# Patient Record
Sex: Male | Born: 1970 | Race: Black or African American | Hispanic: No | Smoking: Current every day smoker
Health system: Southern US, Community
[De-identification: ages and names within clinical notes are randomized; demographics above are authoritative.]

## PROBLEM LIST (undated history)

## (undated) DIAGNOSIS — J9600 Acute respiratory failure, unspecified whether with hypoxia or hypercapnia: Secondary | ICD-10-CM

## (undated) DIAGNOSIS — T50901A Poisoning by unspecified drugs, medicaments and biological substances, accidental (unintentional), initial encounter: Secondary | ICD-10-CM

## (undated) DIAGNOSIS — I639 Cerebral infarction, unspecified: Secondary | ICD-10-CM

---

## 1998-12-21 ENCOUNTER — Emergency Department (HOSPITAL_COMMUNITY): Admission: EM | Admit: 1998-12-21 | Discharge: 1998-12-21 | Payer: Self-pay | Admitting: Emergency Medicine

## 2001-01-30 ENCOUNTER — Emergency Department (HOSPITAL_COMMUNITY): Admission: EM | Admit: 2001-01-30 | Discharge: 2001-01-31 | Payer: Self-pay | Admitting: *Deleted

## 2004-12-20 ENCOUNTER — Emergency Department (HOSPITAL_COMMUNITY): Admission: EM | Admit: 2004-12-20 | Discharge: 2004-12-20 | Payer: Self-pay | Admitting: Emergency Medicine

## 2009-03-25 ENCOUNTER — Emergency Department (HOSPITAL_COMMUNITY): Admission: EM | Admit: 2009-03-25 | Discharge: 2009-03-25 | Payer: Self-pay | Admitting: *Deleted

## 2011-01-09 ENCOUNTER — Emergency Department (HOSPITAL_COMMUNITY)
Admission: EM | Admit: 2011-01-09 | Discharge: 2011-01-09 | Disposition: A | Discharge status: Home / Self Care | Payer: Self-pay | Attending: Emergency Medicine | Admitting: Emergency Medicine

## 2011-01-09 ENCOUNTER — Emergency Department (HOSPITAL_COMMUNITY): Payer: Self-pay

## 2011-01-09 DIAGNOSIS — M7989 Other specified soft tissue disorders: Secondary | ICD-10-CM | POA: Insufficient documentation

## 2011-01-09 DIAGNOSIS — M79609 Pain in unspecified limb: Secondary | ICD-10-CM | POA: Insufficient documentation

## 2011-01-09 DIAGNOSIS — IMO0002 Reserved for concepts with insufficient information to code with codable children: Secondary | ICD-10-CM | POA: Insufficient documentation

## 2011-01-09 DIAGNOSIS — Y92009 Unspecified place in unspecified non-institutional (private) residence as the place of occurrence of the external cause: Secondary | ICD-10-CM | POA: Insufficient documentation

## 2011-01-09 DIAGNOSIS — S62339A Displaced fracture of neck of unspecified metacarpal bone, initial encounter for closed fracture: Secondary | ICD-10-CM | POA: Insufficient documentation

## 2011-02-12 ENCOUNTER — Ambulatory Visit (HOSPITAL_COMMUNITY)
Admission: RE | Admit: 2011-02-12 | Discharge: 2011-02-12 | Disposition: A | Discharge status: Home / Self Care | Payer: Self-pay | Source: Ambulatory Visit | Attending: Plastic Surgery | Admitting: Plastic Surgery

## 2011-02-12 ENCOUNTER — Other Ambulatory Visit (HOSPITAL_COMMUNITY): Payer: Self-pay | Admitting: Plastic Surgery

## 2011-02-12 DIAGNOSIS — IMO0001 Reserved for inherently not codable concepts without codable children: Secondary | ICD-10-CM | POA: Insufficient documentation

## 2011-02-12 DIAGNOSIS — T148XXA Other injury of unspecified body region, initial encounter: Secondary | ICD-10-CM

## 2011-04-13 ENCOUNTER — Emergency Department (HOSPITAL_COMMUNITY)
Admission: EM | Admit: 2011-04-13 | Discharge: 2011-04-13 | Disposition: A | Discharge status: Home / Self Care | Payer: Self-pay | Attending: Emergency Medicine | Admitting: Emergency Medicine

## 2011-04-13 DIAGNOSIS — S41109A Unspecified open wound of unspecified upper arm, initial encounter: Secondary | ICD-10-CM | POA: Insufficient documentation

## 2011-04-13 DIAGNOSIS — Y9229 Other specified public building as the place of occurrence of the external cause: Secondary | ICD-10-CM | POA: Insufficient documentation

## 2011-04-13 DIAGNOSIS — X58XXXA Exposure to other specified factors, initial encounter: Secondary | ICD-10-CM | POA: Insufficient documentation

## 2011-04-23 ENCOUNTER — Emergency Department (HOSPITAL_COMMUNITY)
Admission: EM | Admit: 2011-04-23 | Discharge: 2011-04-23 | Disposition: A | Discharge status: Home / Self Care | Payer: Self-pay | Attending: Emergency Medicine | Admitting: Emergency Medicine

## 2011-04-23 DIAGNOSIS — Z4802 Encounter for removal of sutures: Secondary | ICD-10-CM | POA: Insufficient documentation

## 2011-08-07 ENCOUNTER — Emergency Department (HOSPITAL_COMMUNITY)
Admission: EM | Admit: 2011-08-07 | Discharge: 2011-08-07 | Disposition: A | Discharge status: Home / Self Care | Payer: Self-pay | Attending: Emergency Medicine | Admitting: Emergency Medicine

## 2011-08-07 ENCOUNTER — Emergency Department (HOSPITAL_COMMUNITY): Payer: Self-pay

## 2011-08-07 DIAGNOSIS — M25569 Pain in unspecified knee: Secondary | ICD-10-CM | POA: Insufficient documentation

## 2011-08-07 DIAGNOSIS — IMO0002 Reserved for concepts with insufficient information to code with codable children: Secondary | ICD-10-CM | POA: Insufficient documentation

## 2011-08-07 DIAGNOSIS — W19XXXA Unspecified fall, initial encounter: Secondary | ICD-10-CM | POA: Insufficient documentation

## 2014-09-24 ENCOUNTER — Emergency Department (HOSPITAL_COMMUNITY): Payer: Self-pay

## 2014-09-24 ENCOUNTER — Emergency Department (HOSPITAL_COMMUNITY)
Admission: EM | Admit: 2014-09-24 | Discharge: 2014-09-24 | Disposition: A | Discharge status: Home / Self Care | Payer: Self-pay | Attending: Emergency Medicine | Admitting: Emergency Medicine

## 2014-09-24 ENCOUNTER — Encounter (HOSPITAL_COMMUNITY): Payer: Self-pay | Admitting: Cardiology

## 2014-09-24 DIAGNOSIS — Z720 Tobacco use: Secondary | ICD-10-CM | POA: Insufficient documentation

## 2014-09-24 DIAGNOSIS — Y9389 Activity, other specified: Secondary | ICD-10-CM | POA: Insufficient documentation

## 2014-09-24 DIAGNOSIS — Y998 Other external cause status: Secondary | ICD-10-CM | POA: Insufficient documentation

## 2014-09-24 DIAGNOSIS — S62314A Displaced fracture of base of fourth metacarpal bone, right hand, initial encounter for closed fracture: Secondary | ICD-10-CM | POA: Insufficient documentation

## 2014-09-24 DIAGNOSIS — S62309A Unspecified fracture of unspecified metacarpal bone, initial encounter for closed fracture: Secondary | ICD-10-CM

## 2014-09-24 DIAGNOSIS — S60511A Abrasion of right hand, initial encounter: Secondary | ICD-10-CM | POA: Insufficient documentation

## 2014-09-24 DIAGNOSIS — Y9289 Other specified places as the place of occurrence of the external cause: Secondary | ICD-10-CM | POA: Insufficient documentation

## 2014-09-24 DIAGNOSIS — T1490XA Injury, unspecified, initial encounter: Secondary | ICD-10-CM

## 2014-09-24 MED ORDER — HYDROCODONE-ACETAMINOPHEN 5-325 MG PO TABS: 1.0000 | ORAL_TABLET | Freq: Four times a day (QID) | ORAL | Status: DC | PRN

## 2014-09-24 MED ORDER — HYDROCODONE-ACETAMINOPHEN 5-325 MG PO TABS
2.0000 | ORAL_TABLET | Freq: Once | ORAL | Status: AC
  Administered 2014-09-24: 2 via ORAL
  Filled 2014-09-24: qty 2

## 2014-09-24 MED ORDER — ONDANSETRON 4 MG PO TBDP
8.0000 mg | ORAL_TABLET | Freq: Once | ORAL | Status: AC
Start: 2014-09-24 — End: 2014-09-24
  Administered 2014-09-24: 8 mg via ORAL
  Filled 2014-09-24: qty 2
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
## 2014-09-24 NOTE — ED Notes (Signed)
Ortho at bedside applied splint.

## 2014-09-24 NOTE — Progress Notes (Signed)
Orthopedic Tech Progress Note Patient Details:  Logan Dixon November 25, 1970 [id number]  Ortho Devices Type of Ortho Device: Ace wrap, Ulna gutter splint Ortho Device/Splint Location: rue Ortho Device/Splint Interventions: Application   Logan Dixon 09/24/2014, 11:58 AM

## 2014-09-24 NOTE — ED Notes (Signed)
Pt transported to xray 
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
## 2014-09-24 NOTE — Discharge Instructions (Signed)
It is very important that you follow up with the hand surgeon. Please call his office on Monday morning. You can take the Norco for pain. Please do not combine this with alcohol. Do not drive or operate machinery. Return to the emergency department for new or worsening symptoms.   Metacarpal Fracture   The metacarpal bones are in the middle of the hand, connecting the fingers to the wrist. A metacarpal fracture is a break in one of these bones. It is common for an injury of the hand to break one or more of these bones. A metacarpal fracture of the fifth (little) finger, near the knuckle, is also known as a boxer's fracture. SYMPTOMS   Severe pain at the time of injury.  Pain, tenderness, swelling (especially the back of the hand).  Bruising of the hand within 48 hours.  Visible deformity, if the fracture out of alignment (displaced).  Numbness or paralysis from swelling in the hand, causing pressure on the blood vessels or nerves (uncommon). CAUSES   Direct hit (trauma) to the hand, such as a striking blow with the fist.  Indirect stress to the hand, such as twisting or violent muscle contraction (uncommon). RISK INCREASES WITH:  Contact sports (football, rugby, soccer).  Sports that require hitting (boxing, martial arts).  History of bone or joint disease, including osteoporosis.  Poor hand strength and flexibility. PREVENTION  Maintain proper conditioning:  Hand and finger strength.  Flexibility and endurance.  For contact sports, wear properly fitted and padded protective equipment for the hand.  Learn and use proper technique when hitting, punching, and landing from a fall. PROGNOSIS If treated properly, metacarpal fractures can be expected to heal within 4 to 6 weeks. For severe injuries, surgery may be needed. RELATED COMPLICATIONS   Fracture does not heal (nonunion).  Heals in a poor position, including twisted fingers (malunion).  Chronic pain, stiffness, or  swelling of the hand.  Excessive bleeding in the hand, causing pressure and injury to nerves and blood vessels (rare).  Unstable or arthritic joint, following repeated injury or delayed treatment.  Hindrance of normal hand growth in children.  Infection in open fractures (skin broken over fracture) or at the incision or pin sites, if surgery was performed.  Shortening or injured bones.  Bony bump (spur) or loss of shape of the knuckles. TREATMENT  Treatment will vary, depending on the extent of the injury. First, ice and medicine will help reduce pain and inflammation. For a single metacarpal fracture that is not displaced and does not involve the joint, restraint is usually sufficient for healing to occur. Multiple metacarpal fractures, fractures that are displaced, or fractures involving the joint may require surgery. Surgery often involves placing pins and screws in the bones, to hold them in place. Restraint of the injury follows surgery, to allow for healing. After restraint (with or without surgery), stretching and strengthening exercises may be needed to regain strength and a full range of motion. Exercises may be done at home or with a therapist. Sometimes, depending on the sport and position, a brace or splint may be needed when first returning to sports. MEDICATION   Do not take pain medicine for 7 days before surgery.  Only take over-the-counter or prescription medicines for pain, fever, or discomfort as directed by your caregiver.  Prescription pain medicines are usually prescribed only after surgery. Use only as directed and only as much as you need. COLD THERAPY  Cold treatment (icing) should be applied for 10 to  15 minutes every 2 to 3 hours for inflammation and pain, and immediately after activity that aggravates your symptoms. Use ice packs or an ice massage. SEEK IMMEDIATE MEDICAL CARE IF:   Pain, tenderness, or swelling gets worse even with treatment.  You have pain,  numbness, or coldness in the hand.  Blue, gray, or dark color appears in the fingernails.  Any of the following occur after surgery:  You have an oral temperature above 102 F (38.9 C), not controlled by medicine.  You have increased pain, swelling, redness, drainage of fluids, or bleeding in the affected area.  New, unexplained symptoms develop. (Drugs used in treatment may produce side effects.) Document Released: 11/11/1998 Document Revised: 01/20/2012 Document Reviewed: 02/09/2009 [facility] Patient Information 2015 [employer] [place]. This information is not intended to replace advice given to you by your health care provider. Make sure you discuss any questions you have with your health care provider.  Splint Care Splints protect and rest injuries. Splints can be made of plaster, fiberglass, or metal. They are used to treat broken bones, sprains, tendonitis, and other injuries. HOME CARE  Keep the injured area raised (elevated) while sitting or lying down. Keep the injured body part just above the level of the heart. This will decrease puffiness (swelling) and pain.  If an elastic bandage was used to hold the splint, it can be loosened. Only loosen it to make room for puffiness and to ease pain.  Keep the splint clean and dry.  Do not scratch the skin under the splint with sharp or pointed objects.  Follow up with your doctor as told. GET HELP RIGHT AWAY IF:   There is more pain or pressure around the injury.  There is numbness, tingling, or pain in the toes or fingers past the injury.  The fingers or toes become cold or blue.  The splint becomes too soft or breaks before the injury is healed. MAKE SURE YOU:   Understand these instructions.  Will watch this condition.  Will get help right away if you are not doing well or get worse. Document Released: 08/06/2008 Document Revised: 01/20/2012 Document Reviewed: 08/06/2008 [facility] Patient Information 2015 [place] [employer] [employer] [place].  This information is not intended to replace advice given to you by your health care provider. Make sure you discuss any questions you have with your health care provider.
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
## 2015-11-20 ENCOUNTER — Emergency Department (HOSPITAL_COMMUNITY): Payer: Self-pay

## 2015-11-20 ENCOUNTER — Emergency Department (HOSPITAL_COMMUNITY)
Admission: EM | Admit: 2015-11-20 | Discharge: 2015-11-20 | Disposition: A | Discharge status: Home / Self Care | Payer: Self-pay | Attending: Emergency Medicine | Admitting: Emergency Medicine

## 2015-11-20 ENCOUNTER — Encounter (HOSPITAL_COMMUNITY): Payer: Self-pay | Admitting: Family Medicine

## 2015-11-20 DIAGNOSIS — B349 Viral infection, unspecified: Secondary | ICD-10-CM | POA: Insufficient documentation

## 2015-11-20 DIAGNOSIS — R197 Diarrhea, unspecified: Secondary | ICD-10-CM

## 2015-11-20 DIAGNOSIS — M545 Low back pain, unspecified: Secondary | ICD-10-CM

## 2015-11-20 DIAGNOSIS — R1084 Generalized abdominal pain: Secondary | ICD-10-CM | POA: Insufficient documentation

## 2015-11-20 DIAGNOSIS — F17210 Nicotine dependence, cigarettes, uncomplicated: Secondary | ICD-10-CM | POA: Insufficient documentation

## 2015-11-20 DIAGNOSIS — R509 Fever, unspecified: Secondary | ICD-10-CM

## 2015-11-20 DIAGNOSIS — R000 Tachycardia, unspecified: Secondary | ICD-10-CM | POA: Insufficient documentation

## 2015-11-20 DIAGNOSIS — R52 Pain, unspecified: Secondary | ICD-10-CM

## 2015-11-20 LAB — COMPREHENSIVE METABOLIC PANEL
ALBUMIN: 3.9 g/dL (ref 3.5–5.0)
ALK PHOS: 74 U/L (ref 38–126)
ALT: 31 U/L (ref 17–63)
AST: 27 U/L (ref 15–41)
Anion gap: 9 (ref 5–15)
BILIRUBIN TOTAL: 2.1 mg/dL — AB (ref 0.3–1.2)
BUN: 9 mg/dL (ref 6–20)
CALCIUM: 9.4 mg/dL (ref 8.9–10.3)
CO2: 24 mmol/L (ref 22–32)
Chloride: 102 mmol/L (ref 101–111)
Creatinine, Ser: 1.16 mg/dL (ref 0.61–1.24)
GFR calc Af Amer: >60 mL/min (ref >60)
GFR calc non Af Amer: >60 mL/min (ref >60)
GLUCOSE: 90 mg/dL (ref 65–99)
Potassium: 4 mmol/L (ref 3.5–5.1)
SODIUM: 135 mmol/L (ref 135–145)
TOTAL PROTEIN: 7.3 g/dL (ref 6.5–8.1)

## 2015-11-20 LAB — URINE MICROSCOPIC-ADD ON: RBC / HPF: NONE SEEN RBC/hpf (ref 0–5)

## 2015-11-20 LAB — CBC
HEMATOCRIT: 46.2 % (ref 39.0–52.0)
HEMOGLOBIN: 15.9 g/dL (ref 13.0–17.0)
MCH: 29.9 pg (ref 26.0–34.0)
MCHC: 34.4 g/dL (ref 30.0–36.0)
MCV: 87 fL (ref 78.0–100.0)
Platelets: 271 10*3/uL (ref 150–400)
RBC: 5.31 MIL/uL (ref 4.22–5.81)
RDW: 13.6 % (ref 11.5–15.5)
WBC: 8.4 10*3/uL (ref 4.0–10.5)

## 2015-11-20 LAB — URINALYSIS, ROUTINE W REFLEX MICROSCOPIC
Glucose, UA: NEGATIVE mg/dL
HGB URINE DIPSTICK: NEGATIVE
KETONES UR: 40 mg/dL — AB
NITRITE: NEGATIVE
PH: 6 (ref 5.0–8.0)
Protein, ur: NEGATIVE mg/dL
SPECIFIC GRAVITY, URINE: 1.021 (ref 1.005–1.030)

## 2015-11-20 LAB — LIPASE, BLOOD: Lipase: 20 U/L (ref 11–51)

## 2015-11-20 MED ORDER — ACETAMINOPHEN 325 MG PO TABS
325.0000 mg | ORAL_TABLET | Freq: Once | ORAL | Status: DC
  Filled 2015-11-20: qty 1

## 2015-11-20 MED ORDER — SODIUM CHLORIDE 0.9 % IV BOLUS (SEPSIS)
1000.0000 mL | Freq: Once | INTRAVENOUS | Status: AC
  Administered 2015-11-20: 1000 mL via INTRAVENOUS

## 2015-11-20 MED ORDER — ACETAMINOPHEN 325 MG PO TABS
650.0000 mg | ORAL_TABLET | Freq: Once | ORAL | Status: AC
Start: 2015-11-20 — End: 2015-11-20
  Administered 2015-11-20: 650 mg via ORAL
  Filled 2015-11-20: qty 2

## 2015-11-20 MED ORDER — MORPHINE SULFATE (PF) 4 MG/ML IV SOLN
4.0000 mg | Freq: Once | INTRAVENOUS | Status: AC
  Administered 2015-11-20: 4 mg via INTRAVENOUS
  Filled 2015-11-20: qty 1
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
## 2015-11-20 NOTE — ED Notes (Signed)
Pt here for abd pain, body pains, diarrhea that started last night. sts mid abd pain.

## 2015-11-20 NOTE — ED Notes (Signed)
Patient transported to Ultrasound 

## 2015-11-20 NOTE — Discharge Instructions (Signed)
Stay well hydrated with small sips of fluids throughout the day. Use tylenol or motrin as needed for pain or fever. Follow a BRAT (banana-rice-applesauce-toast) diet as described below for the next 24-48 hours. The 'BRAT' diet is suggested, then progress to diet as tolerated as symptoms abate. Call [facility] if bloody stools, persistent diarrhea, vomiting, fever or worsening abdominal pain. Follow up with [facility] in 2-3 days (walk in hours after 9am on weekdays) to recheck symptoms and for ongoing medical care. Return to ER for changing or worsening of symptoms, including but not limited to: worsening abdominal pain, nausea/vomiting, or fevers persisting despite tylenol/motrin.  Abdominal (belly) pain can be caused by many things. Your caregiver performed an examination and possibly ordered blood/urine tests and imaging (CT scan, x-rays, ultrasound). Many cases can be observed and treated at home after initial evaluation in the emergency department. Even though you are being discharged home, abdominal pain can be unpredictable. Therefore, you need a repeated exam if your pain does not resolve, returns, or worsens. Most patients with abdominal pain don't have to be admitted to the hospital or have surgery, but serious problems like appendicitis and gallbladder attacks can start out as nonspecific pain. Many abdominal conditions cannot be diagnosed in one visit, so follow-up evaluations are very important. SEEK IMMEDIATE MEDICAL ATTENTION IF YOU DEVELOP ANY OF THE FOLLOWING SYMPTOMS:  The pain does not go away or becomes severe.   A temperature above 101 develops.   Repeated vomiting occurs (multiple episodes).   The pain becomes localized to portions of the abdomen. The right side could possibly be appendicitis. In an adult, the left lower portion of the abdomen could be colitis or diverticulitis.   Blood is being passed in stools or vomit (bright red or black tarry  stools).   Return also if you develop chest pain, difficulty breathing, dizziness or fainting, or become confused, poorly responsive, or inconsolable (young children).  The constipation stays for more than 4 days.   There is belly (abdominal) or rectal pain.   You do not seem to be getting better.   Food Choices to Help Relieve Diarrhea When you have diarrhea, the foods you eat and your eating habits are very important. Choosing the right foods and drinks can help relieve diarrhea. Also, because diarrhea can last up to 7 days, you need to replace lost fluids and electrolytes (such as sodium, potassium, and chloride) in order to help prevent dehydration.  WHAT GENERAL GUIDELINES DO I NEED TO FOLLOW?  Slowly drink 1 cup (8 oz) of fluid for each episode of diarrhea. If you are getting enough fluid, your urine will be clear or pale yellow.  Eat starchy foods. Some good choices include white rice, white toast, pasta, low-fiber cereal, baked potatoes (without the skin), saltine crackers, and bagels.  Avoid large servings of any cooked vegetables.  Limit fruit to two servings per day. A serving is  cup or 1 small piece.  Choose foods with less than 2 g of fiber per serving.  Limit fats to less than 8 tsp (38 g) per day.  Avoid fried foods.  Eat foods that have probiotics in them. Probiotics can be found in certain dairy products.  Avoid foods and beverages that may increase the speed at which food moves through the stomach and intestines (gastrointestinal tract). Things to avoid include:  High-fiber foods, such as dried fruit, raw fruits and vegetables, nuts, seeds, and whole grain foods.  Spicy  foods and high-fat foods.  Foods and beverages sweetened with high-fructose corn syrup, honey, or sugar alcohols such as xylitol, sorbitol, and mannitol. WHAT FOODS ARE RECOMMENDED? Grains White rice. White, [place], or pita breads (fresh or toasted), including plain rolls, buns, or bagels.  White pasta. Saltine, soda, or graham crackers. Pretzels. Low-fiber cereal. Cooked cereals made with water (such as cornmeal, farina, or cream cereals). Plain muffins. Matzo. Melba toast. Zwieback.  Vegetables Potatoes (without the skin). Strained tomato and vegetable juices. Most well-cooked and canned vegetables without seeds. Tender lettuce. Fruits Cooked or canned applesauce, apricots, cherries, fruit cocktail, grapefruit, peaches, pears, or plums. Fresh bananas, apples without skin, cherries, grapes, cantaloupe, grapefruit, peaches, oranges, or plums.  Meat and Other Protein Products Baked or boiled chicken. Eggs. Tofu. Fish. Seafood. Smooth peanut butter. Ground or well-cooked tender beef, ham, veal, lamb, pork, or poultry.  Dairy Plain yogurt, kefir, and unsweetened liquid yogurt. Lactose-free milk, buttermilk, or soy milk. Plain hard cheese. Beverages Sport drinks. Clear broths. Diluted fruit juices (except prune). Regular, caffeine-free sodas such as ginger ale. Water. Decaffeinated teas. Oral rehydration solutions. Sugar-free beverages not sweetened with sugar alcohols. Other Bouillon, broth, or soups made from recommended foods.  The items listed above may not be a complete list of recommended foods or beverages. Contact your dietitian for more options. WHAT FOODS ARE NOT RECOMMENDED? Grains Whole grain, whole wheat, bran, or rye breads, rolls, pastas, crackers, and cereals. Wild or brown rice. Cereals that contain more than 2 g of fiber per serving. Corn tortillas or taco shells. Cooked or dry oatmeal. Granola. Popcorn. Vegetables Raw vegetables. Cabbage, broccoli, Brussels sprouts, artichokes, baked beans, beet greens, corn, kale, legumes, peas, sweet potatoes, and yams. Potato skins. Cooked spinach and cabbage. Fruits Dried fruit, including raisins and dates. Raw fruits. Stewed or dried prunes. Fresh apples with skin, apricots, mangoes, pears, raspberries, and strawberries.  Meat  and Other Protein Products Chunky peanut butter. Nuts and seeds. Beans and lentils. Tomasa Blase.  Dairy High-fat cheeses. Milk, chocolate milk, and beverages made with milk, such as milk shakes. Cream. Ice cream. Sweets and Desserts Sweet rolls, doughnuts, and sweet breads. Pancakes and waffles. Fats and Oils Butter. Cream sauces. Margarine. Salad oils. Plain salad dressings. Olives. Avocados.  Beverages Caffeinated beverages (such as coffee, tea, soda, or energy drinks). Alcoholic beverages. Fruit juices with pulp. Prune juice. Soft drinks sweetened with high-fructose corn syrup or sugar alcohols. Other Coconut. Hot sauce. Chili powder. Mayonnaise. Gravy. Cream-based or milk-based soups.  The items listed above may not be a complete list of foods and beverages to avoid. Contact your dietitian for more information. WHAT SHOULD I DO IF I BECOME DEHYDRATED? Diarrhea can sometimes lead to dehydration. Signs of dehydration include dark urine and dry mouth and skin. If you think you are dehydrated, you should rehydrate with an oral rehydration solution. These solutions can be purchased at pharmacies, retail stores, or online.  Drink -1 cup (120-240 mL) of oral rehydration solution each time you have an episode of diarrhea. If drinking this amount makes your diarrhea worse, try drinking smaller amounts more often. For example, drink 1-3 tsp (5-15 mL) every 5-10 minutes.  A general rule for staying hydrated is to drink 1-2 L of fluid per day. Talk to your health care provider about the specific amount you should be drinking each day. Drink enough fluids to keep your urine clear or pale yellow. Document Released: 01/18/2004 Document Revised: 11/02/2013 Document Reviewed: 09/20/2013 [facility] Patient Information 2015 [place], [place]. This information  is not intended to replace advice given to you by your health care provider. Make sure you discuss any questions you have with your health care  provider.   Abdominal Pain, Adult Many things can cause belly (abdominal) pain. Most times, the belly pain is not dangerous. Many cases of belly pain can be watched and treated at home. HOME CARE   Do not take medicines that help you go poop (laxatives) unless told to by your doctor.  Only take medicine as told by your doctor.  Eat or drink as told by your doctor. Your doctor will tell you if you should be on a special diet. GET HELP IF:  You do not know what is causing your belly pain.  You have belly pain while you are sick to your stomach (nauseous) or have runny poop (diarrhea).  You have pain while you pee or poop.  Your belly pain wakes you up at night.  You have belly pain that gets worse or better when you eat.  You have belly pain that gets worse when you eat fatty foods.  You have a fever. GET HELP RIGHT AWAY IF:   The pain does not go away within 2 hours.  You keep throwing up (vomiting).  The pain changes and is only in the right or left part of the belly.  You have bloody or tarry looking poop. MAKE SURE YOU:   Understand these instructions.  Will watch your condition.  Will get help right away if you are not doing well or get worse.   This information is not intended to replace advice given to you by your health care provider. Make sure you discuss any questions you have with your health care provider.   Document Released: 04/15/2008 Document Revised: 11/18/2014 Document Reviewed: 07/07/2013 Elsevier Interactive Patient Education 2016 Else[employer] Inc[employer]  Diarrhea Diarrhea is watery poop (stool). It can make you feel weak, tired, thirsty, or give you a dry mouth (signs of dehydration). Watery poop is a sign of another problem, most often an infection. It often lasts 2-3 days. It can last longer if it is a sign of something serious. Take care of yourself as told by your doctor. HOME CARE   Drink 1 cup (8 ounces) of fluid each time you have watery  poop.  Do not drink the following fluids:  Those that contain simple sugars (fructose, glucose, galactose, lactose, sucrose, maltose).  Sports drinks.  Fruit juices.  Whole milk products.  Sodas.  Drinks with caffeine (coffee, tea, soda) or alcohol.  Oral rehydration solution may be used if the doctor says it is okay. You may make your own solution. Follow this recipe:   - teaspoon table salt.   teaspoon baking soda.   teaspoon salt substitute containing potassium chloride.  1 tablespoons sugar.  1 liter (34 ounces) of water.  Avoid the following foods:  High fiber foods, such as raw fruits and vegetables.  Nuts, seeds, and whole grain breads and cereals.   Those that are sweetened with sugar alcohols (xylitol, sorbitol, mannitol).  Try eating the following foods:  Starchy foods, such as rice, toast, pasta, low-sugar cereal, oatmeal, baked potatoes, crackers, and bagels.  Bananas.  Applesauce.  Eat probiotic-rich foods, such as yogurt and milk products that are fermented.  Wash your hands well after each time you have watery poop.  Only take medicine as told by your doctor.  Take a warm bath to help lessen burning or pain from having watery poop. GET  HELP RIGHT AWAY IF:   You cannot drink fluids without throwing up (vomiting).  You keep throwing up.  You have blood in your poop, or your poop looks black and tarry.  You do not pee (urinate) in 6-8 hours, or there is only a small amount of very dark pee.  You have belly (abdominal) pain that gets worse or stays in the same spot (localizes).  You are weak, dizzy, confused, or light-headed.  You have a very bad headache.  Your watery poop gets worse or does not get better.  You have a fever or lasting symptoms for more than 2-3 days.  You have a fever and your symptoms suddenly get worse. MAKE SURE YOU:   Understand these instructions.  Will watch your condition.  Will get help right away if  you are not doing well or get worse.   This information is not intended to replace advice given to you by your health care provider. Make sure you discuss any questions you have with your health care provider.   Document Released: 04/15/2008 Document Revised: 11/18/2014 Document Reviewed: 07/05/2012 Elsevier Interactive Patient Education 2016 Elsevier Inc.  Fever, Adult A fever is an increase in the body's temperature. It is usually defined as a temperature of 100F (38C) or higher. Brief mild or moderate fevers generally have no long-term effects, and they often do not require treatment. Moderate or high fevers may make you feel uncomfortable and can sometimes be a sign of a serious illness or disease. The sweating that may occur with repeated or prolonged fever may also cause dehydration. Fever is confirmed by taking a temperature with a thermometer. A measured temperature can vary with:  Age.  Time of day.  Location of the thermometer:  Mouth (oral).  Rectum (rectal).  Ear (tympanic).  Underarm (axillary).  Forehead (temporal). HOME CARE INSTRUCTIONS Pay attention to any changes in your symptoms. Take these actions to help with your condition:  Take over-the counter and prescription medicines only as told by your health care provider. Follow the dosing instructions carefully.  If you were prescribed an antibiotic medicine, take it as told by your health care provider. Do not stop taking the antibiotic even if you start to feel better.  Rest as needed.  Drink enough fluid to keep your urine clear or pale yellow. This helps to prevent dehydration.  Sponge yourself or bathe with room-temperature water to help reduce your body temperature as needed. Do not use ice water.  Do not overbundle yourself in blankets or heavy clothes. SEEK MEDICAL CARE IF:  You vomit.  You cannot eat or drink without vomiting.  You have diarrhea.  You have pain when you urinate.  Your  symptoms do not improve with treatment.  You develop new symptoms.  You develop excessive weakness. SEEK IMMEDIATE MEDICAL CARE IF:  You have shortness of breath or have trouble breathing.  You are dizzy or you faint.  You are disoriented or confused.  You develop signs of dehydration, such as a dry mouth, decreased urination, or paleness.  You develop severe pain in your abdomen.  You have persistent vomiting or diarrhea.  You develop a skin rash.  Your symptoms suddenly get worse.   This information is not intended to replace advice given to you by your health care provider. Make sure you discuss any questions you have with your health care provider.   Document Released: 04/23/2001 Document Revised: 07/19/2015 Document Reviewed: 12/22/2014 Elsevier Interactive Patient Education [employer]

## 2016-07-29 ENCOUNTER — Encounter (HOSPITAL_COMMUNITY): Payer: Self-pay | Admitting: *Deleted

## 2016-07-29 ENCOUNTER — Emergency Department (HOSPITAL_COMMUNITY): Payer: Self-pay

## 2016-07-29 ENCOUNTER — Emergency Department (HOSPITAL_COMMUNITY)
Admission: EM | Admit: 2016-07-29 | Discharge: 2016-07-29 | Disposition: A | Discharge status: Home / Self Care | Payer: Self-pay | Attending: Emergency Medicine | Admitting: Emergency Medicine

## 2016-07-29 DIAGNOSIS — F17210 Nicotine dependence, cigarettes, uncomplicated: Secondary | ICD-10-CM | POA: Insufficient documentation

## 2016-07-29 DIAGNOSIS — R0781 Pleurodynia: Secondary | ICD-10-CM | POA: Insufficient documentation

## 2016-07-29 MED ORDER — MELOXICAM 15 MG PO TABS
15.0000 mg | ORAL_TABLET | Freq: Every day | ORAL | 0 refills | Status: DC
Start: 2016-07-29 — End: 2020-06-02

## 2016-07-29 MED ORDER — HYDROCODONE-ACETAMINOPHEN 5-325 MG PO TABS: 2.0000 | ORAL_TABLET | ORAL | 0 refills | Status: DC | PRN

## 2016-07-29 NOTE — ED Provider Notes (Signed)
[facility]-EMERGENCY DEPT Provider Note   CSN: [id number] Arrival date & time: 07/29/16  1141   By signing my name below, I, Sonum Patel, attest that this documentation has been prepared under the direction and in the presence of Wells FargoKelly Lorielle Boehning, PA-C. Electronically Signed: Leone PayorSonum Patel, Scribe. 07/29/16. 3:01 PM.  History   Chief Complaint Chief Complaint  Patient presents with  . Chest Pain    RIB pain    The history is provided by the patient. No language interpreter was used.    HPI Comments: Logan Dixon is a 45 y.o. male who presents to the Emergency Department complaining of 2 days of intermittent, unchanged right rib/chest pain that occurred after playing basketball. He denies known injury or trauma to the affected area. He states the pain is worse with movement. He has taken OTC medication without significant relief. He denies SOB. He denies a cardiac history.    History reviewed. No pertinent past medical history.  There are no active problems to display for this patient.   History reviewed. No pertinent surgical history.   Home Medications    Prior to Admission medications   Medication Sig Start Date End Date Taking? Authorizing Provider  HYDROcodone-acetaminophen (NORCO/VICODIN) 5-325 MG per tablet Take 1-2 tablets by mouth every 6 (six) hours as needed for moderate pain or severe pain. Patient not taking: Reported on 11/20/2015 09/24/14   Junious SilkHannah Merrell, PA-C    Family History No family history on file.  Social History Social History  Substance Use Topics  . Smoking status: Current Every Day Smoker    Packs/day: 0.50    Types: Cigarettes  . Smokeless tobacco: Never Used  . Alcohol use 12.6 oz/week    21 Cans of beer per week     Allergies   Review of patient's allergies indicates no known allergies.   Review of Systems Review of Systems  Respiratory: Negative for shortness of breath.   Cardiovascular: Positive for chest pain.  Musculoskeletal:  Positive for arthralgias.     Physical Exam Updated Vital Signs BP 126/82 (BP Location: Right Arm)   Pulse 82   Temp 98.4 F (36.9 C) (Oral)   Resp 16   Ht 5\' 10"  (1.778 m)   Wt 175 lb (79.4 kg)   SpO2 98%   BMI 25.11 kg/m   Physical Exam  Constitutional: He is oriented to person, place, and time. He appears well-developed and well-nourished.  HENT:  Head: Normocephalic and atraumatic.  Cardiovascular: Normal rate and regular rhythm.  Exam reveals no gallop and no friction rub.   No murmur heard. Pulmonary/Chest: Effort normal and breath sounds normal. No respiratory distress. He has no wheezes. He has no rales. He exhibits tenderness.  Tenderness over left lower ribs and intercostal spaces  Neurological: He is alert and oriented to person, place, and time.  Skin: Skin is warm and dry.  Psychiatric: He has a normal mood and affect.  Nursing note and vitals reviewed.    ED Treatments / Results  DIAGNOSTIC STUDIES: Oxygen Saturation is 98% on RA, normal by my interpretation.    COORDINATION OF CARE: 3:01 PM Discussed treatment plan with pt at bedside and pt agreed to plan.    Labs (all labs ordered are listed, but only abnormal results are displayed) Labs Reviewed - No data to display  EKG  EKG Interpretation  Date/Time:  Monday July 29 2016 13:18:32 EDT Ventricular Rate:  70 PR Interval:  190 QRS Duration: 114 QT Interval:  386 QTC Calculation:  416 R Axis:   6 Text Interpretation:  Normal sinus rhythm Possible Acute pericarditis Abnormal ECG No previous ECGs available Confirmed by ZACKOWSKI  MD, SCOTT [phone]) on 07/30/2016 1:27:16 PM       Radiology Dg Chest 2 View  Result Date: 07/29/2016 CLINICAL DATA:  Right-sided chest and rib pain, no injury, smoking history EXAM: CHEST  2 VIEW COMPARISON:  None. FINDINGS: No active infiltrate or effusion is seen. Mediastinal and hilar contours are unremarkable. The heart is within normal limits in size. No acute  bony abnormality is seen. Small metallic markers were placed over the area of interest which overlies the lower anterior right seventh, eighth and ninth ribs. No underlying rib fracture is evident on the images obtained. IMPRESSION: No active lung disease. No abnormality is seen at the site of pain as described above. Electronically Signed   By: Dwyane Dee M.D.   On: 07/29/2016 14:08    Procedures Procedures (including critical care time)  Medications Ordered in ED Medications - No data to display   Initial Impression / Assessment and Plan / ED Course  I have reviewed the triage vital signs and the nursing notes.  Pertinent labs & imaging results that were available during my care of the patient were reviewed by me and considered in my medical decision making (see chart for details).  Clinical Course   45 year old male presents with MSK chest pain after playing basketball. EKG is NSR. Comments on possible acute pericarditis but patient is afebrile and does not have any other associated symptoms other than pain. CXR is negative. Will d/c with pain meds and antiinflammatories. Patient is NAD, non-toxic, with stable VS. Patient is informed of clinical course, understands medical decision making process, and agrees with plan. Opportunity for questions provided and all questions answered. Return precautions given.   Final Clinical Impressions(s) / ED Diagnoses   Final diagnoses:  Rib pain on right side    New Prescriptions Discharge Medication List as of 07/29/2016  3:05 PM    START taking these medications   Details  meloxicam (MOBIC) 15 MG tablet Take 1 tablet (15 mg total) by mouth daily., Starting Mon 07/29/2016, Print       I personally performed the services described in this documentation, which was scribed in my presence. The recorded information has been reviewed and is accurate.    Bethel Born, PA-C 08/01/16 1739    Gerhard Munch, MD 08/06/16 [phone]

## 2016-07-29 NOTE — ED Triage Notes (Signed)
Pt states R lat rib pain after playing football on Sat.  Denies sob.

## 2016-07-29 NOTE — ED Notes (Signed)
Declined W/C at D/C and was escorted to lobby by RN. 

## 2016-12-24 ENCOUNTER — Emergency Department (HOSPITAL_COMMUNITY): Payer: Self-pay

## 2016-12-24 ENCOUNTER — Encounter (HOSPITAL_COMMUNITY): Payer: Self-pay

## 2016-12-24 ENCOUNTER — Emergency Department (HOSPITAL_COMMUNITY)
Admission: EM | Admit: 2016-12-24 | Discharge: 2016-12-24 | Disposition: A | Discharge status: Home / Self Care | Payer: Self-pay | Attending: Emergency Medicine | Admitting: Emergency Medicine

## 2016-12-24 DIAGNOSIS — Y939 Activity, unspecified: Secondary | ICD-10-CM | POA: Insufficient documentation

## 2016-12-24 DIAGNOSIS — Y999 Unspecified external cause status: Secondary | ICD-10-CM | POA: Insufficient documentation

## 2016-12-24 DIAGNOSIS — Y929 Unspecified place or not applicable: Secondary | ICD-10-CM | POA: Insufficient documentation

## 2016-12-24 DIAGNOSIS — F17210 Nicotine dependence, cigarettes, uncomplicated: Secondary | ICD-10-CM | POA: Insufficient documentation

## 2016-12-24 DIAGNOSIS — S60221A Contusion of right hand, initial encounter: Secondary | ICD-10-CM | POA: Insufficient documentation

## 2016-12-24 MED ORDER — NAPROXEN 500 MG PO TABS: 500.0000 mg | ORAL_TABLET | Freq: Two times a day (BID) | ORAL | 0 refills | Status: DC
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
## 2017-10-22 ENCOUNTER — Emergency Department (HOSPITAL_COMMUNITY)
Admission: EM | Admit: 2017-10-22 | Discharge: 2017-10-22 | Disposition: A | Discharge status: Home / Self Care | Payer: Self-pay | Attending: Emergency Medicine | Admitting: Emergency Medicine

## 2017-10-22 ENCOUNTER — Other Ambulatory Visit: Payer: Self-pay

## 2017-10-22 ENCOUNTER — Emergency Department (HOSPITAL_COMMUNITY): Payer: Self-pay

## 2017-10-22 ENCOUNTER — Encounter (HOSPITAL_COMMUNITY): Payer: Self-pay | Admitting: Emergency Medicine

## 2017-10-22 DIAGNOSIS — R51 Headache: Secondary | ICD-10-CM | POA: Insufficient documentation

## 2017-10-22 DIAGNOSIS — Y929 Unspecified place or not applicable: Secondary | ICD-10-CM | POA: Insufficient documentation

## 2017-10-22 DIAGNOSIS — Y999 Unspecified external cause status: Secondary | ICD-10-CM | POA: Insufficient documentation

## 2017-10-22 DIAGNOSIS — Y939 Activity, unspecified: Secondary | ICD-10-CM | POA: Insufficient documentation

## 2017-10-22 DIAGNOSIS — S0081XA Abrasion of other part of head, initial encounter: Secondary | ICD-10-CM | POA: Insufficient documentation

## 2017-10-22 DIAGNOSIS — F17210 Nicotine dependence, cigarettes, uncomplicated: Secondary | ICD-10-CM | POA: Insufficient documentation

## 2017-10-22 DIAGNOSIS — T07XXXA Unspecified multiple injuries, initial encounter: Secondary | ICD-10-CM

## 2017-10-22 DIAGNOSIS — W19XXXA Unspecified fall, initial encounter: Secondary | ICD-10-CM | POA: Insufficient documentation

## 2017-10-22 MED ORDER — IBUPROFEN 800 MG PO TABS: 800.0000 mg | ORAL_TABLET | Freq: Three times a day (TID) | ORAL | 0 refills | Status: DC

## 2017-10-22 MED ORDER — ACETAMINOPHEN 500 MG PO TABS
1000.0000 mg | ORAL_TABLET | Freq: Once | ORAL | Status: AC
  Administered 2017-10-22: 1000 mg via ORAL
  Filled 2017-10-22: qty 2

## 2017-10-22 MED ORDER — NAPROXEN 250 MG PO TABS
500.0000 mg | ORAL_TABLET | Freq: Once | ORAL | Status: AC
  Administered 2017-10-22: 500 mg via ORAL
  Filled 2017-10-22: qty 2
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
## 2020-06-01 ENCOUNTER — Emergency Department (HOSPITAL_COMMUNITY)
Admission: EM | Admit: 2020-06-01 | Discharge: 2020-06-02 | Disposition: A | Discharge status: Home / Self Care | Payer: Commercial Managed Care - PPO | Attending: Emergency Medicine | Admitting: Emergency Medicine

## 2020-06-01 ENCOUNTER — Other Ambulatory Visit: Payer: Self-pay

## 2020-06-01 DIAGNOSIS — R0789 Other chest pain: Secondary | ICD-10-CM | POA: Insufficient documentation

## 2020-06-01 DIAGNOSIS — R05 Cough: Secondary | ICD-10-CM | POA: Diagnosis not present

## 2020-06-01 DIAGNOSIS — R109 Unspecified abdominal pain: Secondary | ICD-10-CM | POA: Insufficient documentation

## 2020-06-01 DIAGNOSIS — R197 Diarrhea, unspecified: Secondary | ICD-10-CM | POA: Diagnosis not present

## 2020-06-01 DIAGNOSIS — Z20822 Contact with and (suspected) exposure to covid-19: Secondary | ICD-10-CM | POA: Insufficient documentation

## 2020-06-01 DIAGNOSIS — R110 Nausea: Secondary | ICD-10-CM | POA: Diagnosis not present

## 2020-06-01 DIAGNOSIS — F17210 Nicotine dependence, cigarettes, uncomplicated: Secondary | ICD-10-CM | POA: Diagnosis not present

## 2020-06-01 DIAGNOSIS — J069 Acute upper respiratory infection, unspecified: Secondary | ICD-10-CM | POA: Insufficient documentation

## 2020-06-01 NOTE — ED Triage Notes (Signed)
Pt here for eval of couch and chest congestion x 2 days. Denies sick contacts.

## 2020-06-02 ENCOUNTER — Emergency Department (HOSPITAL_COMMUNITY): Payer: Commercial Managed Care - PPO

## 2020-06-02 LAB — GROUP A STREP BY PCR: Group A Strep by PCR: NOT DETECTED

## 2020-06-02 LAB — SARS CORONAVIRUS 2 BY RT PCR (HOSPITAL ORDER, PERFORMED IN ~~LOC~~ HOSPITAL LAB): SARS Coronavirus 2: NEGATIVE

## 2020-06-02 MED ORDER — AEROCHAMBER PLUS FLO-VU LARGE MISC
1.0000 | Freq: Once | Status: DC
  Filled 2020-06-02: qty 1

## 2020-06-02 MED ORDER — ALBUTEROL SULFATE HFA 108 (90 BASE) MCG/ACT IN AERS
8.0000 | INHALATION_SPRAY | Freq: Once | RESPIRATORY_TRACT | Status: AC
  Administered 2020-06-02: 8 via RESPIRATORY_TRACT
  Filled 2020-06-02: qty 6.7

## 2020-06-02 MED ORDER — BENZONATATE 100 MG PO CAPS: 100.0000 mg | ORAL_CAPSULE | Freq: Three times a day (TID) | ORAL | 0 refills | Status: DC

## 2020-06-02 MED ORDER — GUAIFENESIN 100 MG/5ML PO SOLN
5.0000 mL | Freq: Once | ORAL | Status: AC
  Administered 2020-06-02: 100 mg via ORAL
  Filled 2020-06-02: qty 5

## 2020-06-02 MED ORDER — ONDANSETRON 4 MG PO TBDP: 4.0000 mg | ORAL_TABLET | Freq: Three times a day (TID) | ORAL | 0 refills | Status: DC | PRN

## 2020-06-02 MED ORDER — ACETAMINOPHEN 325 MG PO TABS
650.0000 mg | ORAL_TABLET | Freq: Once | ORAL | Status: AC
  Administered 2020-06-02: 650 mg via ORAL
  Filled 2020-06-02: qty 2

## 2020-06-02 NOTE — ED Notes (Signed)
Ambulated pt in hall. Initial SPO2 was 97. After ambulating SPO2 was 95. Pt ambulated with steady gait.

## 2020-06-02 NOTE — ED Provider Notes (Signed)
[facility] EMERGENCY DEPARTMENT Provider Note   CSN: [id number] Arrival date & time: 06/01/20  1731     History Chief Complaint  Patient presents with   Cough    Logan Dixon is a 49 y.o. male with a h/o tobacco use disorder who presents the emergency department with a chief complaint of cough.  The patient reports an intermittently productive cough with clear sputum, chest tightness that is only present with coughing and then resolves, nasal congestion, rhinorrhea, sore throat, headache, nausea, epigastric abdominal pain, and posttussive emesis that began 2 days ago.  He denies fever, chills, chest pain, diarrhea, loss of sense of taste or smell, neck pain or stiffness, dysuria, hematuria, numbness, or weakness.  He has had no vomiting other than posttussive emesis.  He reports that one of his coworkers was recently ill with similar symptoms.  He is not vaccinated against COVID-19.  He has no chronic medical conditions and takes no daily medications.  He is a 0.5 PPD smoker.  He also reports social alcohol use.  Denies other illicit or recreational substance use.  Logan Dixon was evaluated in Emergency Department on 06/02/2020 for the symptoms described in the history of present illness. He was evaluated in the context of the global COVID-19 pandemic, which necessitated consideration that the patient might be at risk for infection with the SARS-CoV-2 virus that causes COVID-19. Institutional protocols and algorithms that pertain to the evaluation of patients at risk for COVID-19 are in a state of rapid change based on information released by regulatory bodies including the CDC and federal and state organizations. These policies and algorithms were followed during the patient's care in the ED.   The history is provided by the patient. No language interpreter was used.       No past medical history on file.  There are no problems to display for this  patient.   No past surgical history on file.     No family history on file.  Social History   Tobacco Use   Smoking status: Current Every Day Smoker    Packs/day: 0.50    Types: Cigarettes   Smokeless tobacco: Never Used  Substance Use Topics   Alcohol use: Yes    Alcohol/week: 21.0 standard drinks    Types: 21 Cans of beer per week   Drug use: No    Home Medications Prior to Admission medications   Medication Sig Start Date End Date Taking? Authorizing Provider  benzonatate (TESSALON) 100 MG capsule Take 1 capsule (100 mg total) by mouth every 8 (eight) hours. 06/02/20   Briarrose Shor A, PA-C  ondansetron (ZOFRAN ODT) 4 MG disintegrating tablet Take 1 tablet (4 mg total) by mouth every 8 (eight) hours as needed. 06/02/20   Lilias Lorensen A, PA-C    Allergies    Patient has no known allergies.  Review of Systems   Review of Systems  Constitutional: Negative for appetite change, chills, diaphoresis and fever.  HENT: Positive for congestion, ear pain and sore throat. Negative for facial swelling, hearing loss, nosebleeds, sinus pressure, sinus pain, tinnitus, trouble swallowing and voice change.        No loss of sense of taste and smell  Eyes: Negative for visual disturbance.  Respiratory: Positive for cough and shortness of breath. Negative for choking.        Post-tussive emesis  Cardiovascular: Negative for chest pain, palpitations and leg swelling.  Gastrointestinal: Positive for abdominal pain, diarrhea and nausea. Negative for  blood in stool, constipation and vomiting.  Genitourinary: Negative for discharge, dysuria, flank pain, penile pain and urgency.  Musculoskeletal: Negative for arthralgias, back pain, gait problem, myalgias, neck pain and neck stiffness.  Skin: Negative for rash.  Allergic/Immunologic: Negative for immunocompromised state.  Neurological: Positive for headaches. Negative for dizziness, seizures, syncope, weakness and numbness.   Psychiatric/Behavioral: Negative for confusion.    Physical Exam Updated Vital Signs BP (!) 138/72 (BP Location: Right Arm)    Pulse 85    Temp 99.1 F (37.3 C) (Oral)    Resp 18    Ht 5\' 10"  (1.778 m)    Wt 91.2 kg    SpO2 97%    BMI 28.84 kg/m   Physical Exam Vitals and nursing note reviewed.  Constitutional:      General: He is not in acute distress.    Appearance: He is well-developed. He is not ill-appearing, toxic-appearing or diaphoretic.  HENT:     Head: Normocephalic.     Right Ear: Tympanic membrane normal.     Left Ear: Tympanic membrane normal.     Ears:     Comments: Bilateral canals are erythematous.  No mastoid tenderness bilaterally.    Nose: Congestion and rhinorrhea present.     Right Sinus: No maxillary sinus tenderness or frontal sinus tenderness.     Left Sinus: No maxillary sinus tenderness or frontal sinus tenderness.     Mouth/Throat:     Mouth: Mucous membranes are moist.     Pharynx: Oropharynx is clear. Uvula midline. Posterior oropharyngeal erythema present. No pharyngeal swelling, oropharyngeal exudate or uvula swelling.     Tonsils: No tonsillar exudate or tonsillar abscesses.  Eyes:     General:        Right eye: No discharge.        Left eye: No discharge.     Extraocular Movements: Extraocular movements intact.     Conjunctiva/sclera: Conjunctivae normal.     Pupils: Pupils are equal, round, and reactive to light.  Cardiovascular:     Rate and Rhythm: Normal rate and regular rhythm.     Heart sounds: No murmur heard.   Pulmonary:     Effort: Pulmonary effort is normal. No respiratory distress.     Breath sounds: No stridor. No wheezing, rhonchi or rales.  Chest:     Chest wall: No tenderness.  Abdominal:     General: There is no distension.     Palpations: Abdomen is soft.     Tenderness: There is abdominal tenderness.     Comments: Mildly tender to palpation in the epigastric region without rebound or guarding.  Negative Murphy sign.   No CVA tenderness bilaterally.  Abdomen is soft and nondistended with normoactive bowel sounds.  Musculoskeletal:     Cervical back: Neck supple.     Left lower leg: No edema.  Skin:    General: Skin is warm and dry.  Neurological:     Mental Status: He is alert.  Psychiatric:        Behavior: Behavior normal.     ED Results / Procedures / Treatments   Labs (all labs ordered are listed, but only abnormal results are displayed) Labs Reviewed  SARS CORONAVIRUS 2 BY RT PCR (HOSPITAL ORDER, PERFORMED IN [facility])  GROUP A STREP BY PCR    EKG None  Radiology DG Chest 2 View  Result Date: 06/02/2020 CLINICAL DATA:  Cough and congestion. EXAM: CHEST - 2 VIEW COMPARISON:  July 29, 2016. FINDINGS: The heart size and mediastinal contours are within normal limits. Both lungs are clear. The visualized skeletal structures are unremarkable. There is a relatively stable appearance of the bilateral hila, especially on the left. IMPRESSION: No active cardiopulmonary disease. Electronically Signed   By: Katherine Mantle M.D.   On: 06/02/2020 01:02    Procedures Procedures (including critical care time)  Medications Ordered in ED Medications  AeroChamber Plus Flo-Vu Large MISC 1 each (1 each Other Not Given 06/02/20 0149)  albuterol (VENTOLIN HFA) 108 (90 Base) MCG/ACT inhaler 8 puff (8 puffs Inhalation Given 06/02/20 0149)  acetaminophen (TYLENOL) tablet 650 mg (650 mg Oral Given 06/02/20 0148)  guaiFENesin (ROBITUSSIN) 100 MG/5ML solution 100 mg (100 mg Oral Given 06/02/20 0148)    ED Course  I have reviewed the triage vital signs and the nursing notes.  Pertinent labs & imaging results that were available during my care of the patient were reviewed by me and considered in my medical decision making (see chart for details).    MDM Rules/Calculators/A&P                          49 year old male with a history of tobacco use disorder who has had 2 days of URI  symptoms.  Chest x-ray is unremarkable.  COVID-19 test is negative.  Strep PCR test is negative.  He was given Tylenol for a headache, albuterol, and guaifenesin, and on reevaluation he was much improved.  He was ambulated on room air and did not have any hypoxia.  Symptoms are consistent with viral URI.  Doubt ACS, PE, pancreatitis, cholecystitis, appendicitis, meningitis.  Will discharge home with symptomatic management.  He has been advised to follow-up with primary care if symptoms persist.  ER return precautions given.  He is hemodynamically stable and in no acute distress.  Safe discharge to home with outpatient follow-up as indicated.  Final Clinical Impression(s) / ED Diagnoses Final diagnoses:  Viral URI with cough    Rx / DC Orders ED Discharge Orders         Ordered    benzonatate (TESSALON) 100 MG capsule  Every 8 hours     Discontinue  Reprint     06/02/20 0247    ondansetron (ZOFRAN ODT) 4 MG disintegrating tablet  Every 8 hours PRN     Discontinue  Reprint     06/02/20 0247           Gentri Guardado A, PA-C 06/02/20 0454    Gilda Crease, MD 06/02/20 [phone]

## 2020-06-02 NOTE — Discharge Instructions (Addendum)
Thank you for allowing me to care for you today in the Emergency Department.   You were seen today for cough and cold symptoms.  Your work-up is reassuring.  Your chest x-ray did not show any evidence of pneumonia.  Your COVID-19 test was negative.  Your strep test was negative.  Take 650 mg of Tylenol or 600 mg of ibuprofen with food every 6 hours for pain, headache, or fever.  You can alternate between these 2 medications every 3 hours if your pain returns.  For instance, you can take Tylenol at noon, followed by a dose of ibuprofen at 3, followed by second dose of Tylenol and 6.  Take 1 tablet of benzonatate every 8 hours as needed for cough.  Guaifenesin is also available over-the-counter and can be used for your symptoms.  Use 2 to 4 puffs of the albuterol inhaler every 4-6 hours as needed for shortness of breath and cough.  1 tablet of Zofran dissolve under tongue every 8 hours as needed for nausea.  Make sure that you are getting plenty of rest and hydrating well.  You can call the number on your discharge paperwork to get established with a primary care provider for follow-up.  Return to the ER if you develop significant trouble breathing, if you develop uncontrollable vomiting and cannot hold down any food or fluids, if your fingers or lips turn blue, if you develop significant chest pain, or other new, concerning symptoms.

## 2020-09-10 ENCOUNTER — Observation Stay (HOSPITAL_COMMUNITY): Payer: Commercial Managed Care - PPO

## 2020-09-10 ENCOUNTER — Observation Stay (HOSPITAL_COMMUNITY)
Admission: EM | Admit: 2020-09-10 | Discharge: 2020-09-12 | Disposition: A | Discharge status: Home / Self Care | Payer: Commercial Managed Care - PPO | Attending: Internal Medicine | Admitting: Internal Medicine

## 2020-09-10 ENCOUNTER — Other Ambulatory Visit: Payer: Self-pay

## 2020-09-10 ENCOUNTER — Emergency Department (HOSPITAL_COMMUNITY): Payer: Commercial Managed Care - PPO

## 2020-09-10 ENCOUNTER — Encounter (HOSPITAL_COMMUNITY): Payer: Self-pay | Admitting: Internal Medicine

## 2020-09-10 DIAGNOSIS — J9601 Acute respiratory failure with hypoxia: Secondary | ICD-10-CM | POA: Diagnosis not present

## 2020-09-10 DIAGNOSIS — J9600 Acute respiratory failure, unspecified whether with hypoxia or hypercapnia: Secondary | ICD-10-CM | POA: Diagnosis present

## 2020-09-10 DIAGNOSIS — G9340 Encephalopathy, unspecified: Secondary | ICD-10-CM | POA: Diagnosis present

## 2020-09-10 DIAGNOSIS — I63439 Cerebral infarction due to embolism of unspecified posterior cerebral artery: Secondary | ICD-10-CM

## 2020-09-10 DIAGNOSIS — F1910 Other psychoactive substance abuse, uncomplicated: Secondary | ICD-10-CM | POA: Diagnosis not present

## 2020-09-10 DIAGNOSIS — R0902 Hypoxemia: Secondary | ICD-10-CM

## 2020-09-10 DIAGNOSIS — T1490XA Injury, unspecified, initial encounter: Secondary | ICD-10-CM

## 2020-09-10 DIAGNOSIS — T50901A Poisoning by unspecified drugs, medicaments and biological substances, accidental (unintentional), initial encounter: Secondary | ICD-10-CM

## 2020-09-10 DIAGNOSIS — I6330 Cerebral infarction due to thrombosis of unspecified cerebral artery: Secondary | ICD-10-CM | POA: Insufficient documentation

## 2020-09-10 DIAGNOSIS — Y92194 Driveway of other specified residential institution as the place of occurrence of the external cause: Secondary | ICD-10-CM | POA: Diagnosis not present

## 2020-09-10 DIAGNOSIS — S0101XA Laceration without foreign body of scalp, initial encounter: Secondary | ICD-10-CM | POA: Diagnosis not present

## 2020-09-10 DIAGNOSIS — Z20822 Contact with and (suspected) exposure to covid-19: Secondary | ICD-10-CM | POA: Insufficient documentation

## 2020-09-10 DIAGNOSIS — S0990XA Unspecified injury of head, initial encounter: Secondary | ICD-10-CM | POA: Diagnosis present

## 2020-09-10 DIAGNOSIS — Z7982 Long term (current) use of aspirin: Secondary | ICD-10-CM | POA: Insufficient documentation

## 2020-09-10 DIAGNOSIS — W1839XA Other fall on same level, initial encounter: Secondary | ICD-10-CM | POA: Diagnosis not present

## 2020-09-10 DIAGNOSIS — R400 Somnolence: Secondary | ICD-10-CM | POA: Insufficient documentation

## 2020-09-10 LAB — CBC WITH DIFFERENTIAL/PLATELET
Abs Immature Granulocytes: 0.07 10*3/uL (ref 0.00–0.07)
Basophils Absolute: 0 10*3/uL (ref 0.0–0.1)
Basophils Relative: 0 %
Eosinophils Absolute: 0 10*3/uL (ref 0.0–0.5)
Eosinophils Relative: 0 %
HCT: 44.2 % (ref 39.0–52.0)
Hemoglobin: 14.2 g/dL (ref 13.0–17.0)
Immature Granulocytes: 1 %
Lymphocytes Relative: 5 %
Lymphs Abs: 0.7 10*3/uL (ref 0.7–4.0)
MCH: 29.2 pg (ref 26.0–34.0)
MCHC: 32.1 g/dL (ref 30.0–36.0)
MCV: 90.9 fL (ref 80.0–100.0)
Monocytes Absolute: 0.4 10*3/uL (ref 0.1–1.0)
Monocytes Relative: 3 %
Neutro Abs: 13.5 10*3/uL — ABNORMAL HIGH (ref 1.7–7.7)
Neutrophils Relative %: 91 %
Platelets: 272 10*3/uL (ref 150–400)
RBC: 4.86 MIL/uL (ref 4.22–5.81)
RDW: 13.2 % (ref 11.5–15.5)
WBC: 14.7 10*3/uL — ABNORMAL HIGH (ref 4.0–10.5)
nRBC: 0 % (ref 0.0–0.2)

## 2020-09-10 LAB — RAPID URINE DRUG SCREEN, HOSP PERFORMED
Amphetamines: POSITIVE — AB
Barbiturates: NOT DETECTED
Benzodiazepines: NOT DETECTED
Cocaine: POSITIVE — AB
Opiates: NOT DETECTED
Tetrahydrocannabinol: NOT DETECTED

## 2020-09-10 LAB — I-STAT ARTERIAL BLOOD GAS, ED
Acid-Base Excess: 5 mmol/L — ABNORMAL HIGH (ref 0.0–2.0)
Bicarbonate: 32.3 mmol/L — ABNORMAL HIGH (ref 20.0–28.0)
Calcium, Ion: 1.27 mmol/L (ref 1.15–1.40)
HCT: 41 % (ref 39.0–52.0)
Hemoglobin: 13.9 g/dL (ref 13.0–17.0)
O2 Saturation: 95 %
Patient temperature: 96.1
Potassium: 4.9 mmol/L (ref 3.5–5.1)
Sodium: 136 mmol/L (ref 135–145)
TCO2: 34 mmol/L — ABNORMAL HIGH (ref 22–32)
pCO2 arterial: 56.4 mmHg — ABNORMAL HIGH (ref 32.0–48.0)
pH, Arterial: 7.36 (ref 7.350–7.450)
pO2, Arterial: 74 mmHg — ABNORMAL LOW (ref 83.0–108.0)

## 2020-09-10 LAB — I-STAT CHEM 8, ED
BUN: 9 mg/dL (ref 6–20)
Calcium, Ion: 1.13 mmol/L — ABNORMAL LOW (ref 1.15–1.40)
Chloride: 100 mmol/L (ref 98–111)
Creatinine, Ser: 1.6 mg/dL — ABNORMAL HIGH (ref 0.61–1.24)
Glucose, Bld: 183 mg/dL — ABNORMAL HIGH (ref 70–99)
HCT: 42 % (ref 39.0–52.0)
Hemoglobin: 14.3 g/dL (ref 13.0–17.0)
Potassium: 2.9 mmol/L — ABNORMAL LOW (ref 3.5–5.1)
Sodium: 138 mmol/L (ref 135–145)
TCO2: 27 mmol/L (ref 22–32)

## 2020-09-10 LAB — I-STAT VENOUS BLOOD GAS, ED
Acid-Base Excess: 6 mmol/L — ABNORMAL HIGH (ref 0.0–2.0)
Acid-Base Excess: 6 mmol/L — ABNORMAL HIGH (ref 0.0–2.0)
Bicarbonate: 32.9 mmol/L — ABNORMAL HIGH (ref 20.0–28.0)
Bicarbonate: 34 mmol/L — ABNORMAL HIGH (ref 20.0–28.0)
Calcium, Ion: 1.14 mmol/L — ABNORMAL LOW (ref 1.15–1.40)
Calcium, Ion: 1.12 mmol/L — ABNORMAL LOW (ref 1.15–1.40)
HCT: 46 % (ref 39.0–52.0)
HCT: 46 % (ref 39.0–52.0)
Hemoglobin: 15.6 g/dL (ref 13.0–17.0)
Hemoglobin: 15.6 g/dL (ref 13.0–17.0)
O2 Saturation: 97 %
O2 Saturation: 100 %
Potassium: 5.4 mmol/L — ABNORMAL HIGH (ref 3.5–5.1)
Potassium: 5.3 mmol/L — ABNORMAL HIGH (ref 3.5–5.1)
Sodium: 139 mmol/L (ref 135–145)
Sodium: 138 mmol/L (ref 135–145)
TCO2: 35 mmol/L — ABNORMAL HIGH (ref 22–32)
TCO2: 36 mmol/L — ABNORMAL HIGH (ref 22–32)
pCO2, Ven: 57.2 mmHg (ref 44.0–60.0)
pCO2, Ven: 61.5 mmHg — ABNORMAL HIGH (ref 44.0–60.0)
pH, Ven: 7.368 (ref 7.250–7.430)
pH, Ven: 7.351 (ref 7.250–7.430)
pO2, Ven: 94 mmHg — ABNORMAL HIGH (ref 32.0–45.0)
pO2, Ven: 202 mmHg — ABNORMAL HIGH (ref 32.0–45.0)

## 2020-09-10 LAB — CBC
HCT: 43.7 % (ref 39.0–52.0)
HCT: 44.2 % (ref 39.0–52.0)
Hemoglobin: 14.2 g/dL (ref 13.0–17.0)
Hemoglobin: 14.2 g/dL (ref 13.0–17.0)
MCH: 29.2 pg (ref 26.0–34.0)
MCH: 29.1 pg (ref 26.0–34.0)
MCHC: 32.5 g/dL (ref 30.0–36.0)
MCHC: 32.1 g/dL (ref 30.0–36.0)
MCV: 89.7 fL (ref 80.0–100.0)
MCV: 90.6 fL (ref 80.0–100.0)
Platelets: 314 10*3/uL (ref 150–400)
Platelets: 267 10*3/uL (ref 150–400)
RBC: 4.87 MIL/uL (ref 4.22–5.81)
RBC: 4.88 MIL/uL (ref 4.22–5.81)
RDW: 13.1 % (ref 11.5–15.5)
RDW: 13.2 % (ref 11.5–15.5)
WBC: 12.3 10*3/uL — ABNORMAL HIGH (ref 4.0–10.5)
WBC: 14.5 10*3/uL — ABNORMAL HIGH (ref 4.0–10.5)
nRBC: 0 % (ref 0.0–0.2)
nRBC: 0 % (ref 0.0–0.2)

## 2020-09-10 LAB — ETHANOL: Alcohol, Ethyl (B): 86 mg/dL — ABNORMAL HIGH (ref <10)

## 2020-09-10 LAB — COMPREHENSIVE METABOLIC PANEL
ALT: 28 U/L (ref 0–44)
AST: 34 U/L (ref 15–41)
Albumin: 3.8 g/dL (ref 3.5–5.0)
Alkaline Phosphatase: 63 U/L (ref 38–126)
Anion gap: 11 (ref 5–15)
BUN: 9 mg/dL (ref 6–20)
CO2: 25 mmol/L (ref 22–32)
Calcium: 8.6 mg/dL — ABNORMAL LOW (ref 8.9–10.3)
Chloride: 101 mmol/L (ref 98–111)
Creatinine, Ser: 1.47 mg/dL — ABNORMAL HIGH (ref 0.61–1.24)
GFR, Estimated: 58 mL/min — ABNORMAL LOW (ref >60)
Glucose, Bld: 179 mg/dL — ABNORMAL HIGH (ref 70–99)
Potassium: 2.8 mmol/L — ABNORMAL LOW (ref 3.5–5.1)
Sodium: 137 mmol/L (ref 135–145)
Total Bilirubin: 1.1 mg/dL (ref 0.3–1.2)
Total Protein: 7.1 g/dL (ref 6.5–8.1)

## 2020-09-10 LAB — TSH: TSH: 0.626 u[IU]/mL (ref 0.350–4.500)

## 2020-09-10 LAB — HEPATIC FUNCTION PANEL
ALT: 27 U/L (ref 0–44)
AST: 26 U/L (ref 15–41)
Albumin: 3.9 g/dL (ref 3.5–5.0)
Alkaline Phosphatase: 70 U/L (ref 38–126)
Bilirubin, Direct: 0.2 mg/dL (ref 0.0–0.2)
Indirect Bilirubin: 1.4 mg/dL — ABNORMAL HIGH (ref 0.3–0.9)
Total Bilirubin: 1.6 mg/dL — ABNORMAL HIGH (ref 0.3–1.2)
Total Protein: 7.6 g/dL (ref 6.5–8.1)

## 2020-09-10 LAB — BASIC METABOLIC PANEL
Anion gap: 9 (ref 5–15)
Anion gap: 8 (ref 5–15)
BUN: 12 mg/dL (ref 6–20)
BUN: 13 mg/dL (ref 6–20)
CO2: 28 mmol/L (ref 22–32)
CO2: 28 mmol/L (ref 22–32)
Calcium: 9 mg/dL (ref 8.9–10.3)
Calcium: 9.2 mg/dL (ref 8.9–10.3)
Chloride: 101 mmol/L (ref 98–111)
Chloride: 101 mmol/L (ref 98–111)
Creatinine, Ser: 1.46 mg/dL — ABNORMAL HIGH (ref 0.61–1.24)
Creatinine, Ser: 1.26 mg/dL — ABNORMAL HIGH (ref 0.61–1.24)
GFR, Estimated: 59 mL/min — ABNORMAL LOW (ref >60)
GFR, Estimated: >60 mL/min (ref >60)
Glucose, Bld: 123 mg/dL — ABNORMAL HIGH (ref 70–99)
Glucose, Bld: 126 mg/dL — ABNORMAL HIGH (ref 70–99)
Potassium: 5.3 mmol/L — ABNORMAL HIGH (ref 3.5–5.1)
Potassium: 4.9 mmol/L (ref 3.5–5.1)
Sodium: 138 mmol/L (ref 135–145)
Sodium: 137 mmol/L (ref 135–145)

## 2020-09-10 LAB — LACTIC ACID, PLASMA
Lactic Acid, Venous: 3.1 mmol/L (ref 0.5–1.9)
Lactic Acid, Venous: 1.4 mmol/L (ref 0.5–1.9)
Lactic Acid, Venous: 1.1 mmol/L (ref 0.5–1.9)

## 2020-09-10 LAB — TROPONIN I (HIGH SENSITIVITY)
Troponin I (High Sensitivity): 9 ng/L (ref <18)
Troponin I (High Sensitivity): 9 ng/L (ref <18)
Troponin I (High Sensitivity): 9 ng/L (ref <18)

## 2020-09-10 LAB — SALICYLATE LEVEL: Salicylate Lvl: <7 mg/dL — ABNORMAL LOW (ref 7.0–30.0)

## 2020-09-10 LAB — PROTIME-INR
INR: 1 (ref 0.8–1.2)
Prothrombin Time: 12.7 seconds (ref 11.4–15.2)

## 2020-09-10 LAB — RESPIRATORY PANEL BY RT PCR (FLU A&B, COVID)
Influenza A by PCR: NEGATIVE
Influenza B by PCR: NEGATIVE
SARS Coronavirus 2 by RT PCR: NEGATIVE

## 2020-09-10 LAB — CBG MONITORING, ED
Glucose-Capillary: 119 mg/dL — ABNORMAL HIGH (ref 70–99)
Glucose-Capillary: 123 mg/dL — ABNORMAL HIGH (ref 70–99)

## 2020-09-10 LAB — SAMPLE TO BLOOD BANK

## 2020-09-10 LAB — BRAIN NATRIURETIC PEPTIDE: B Natriuretic Peptide: 28.7 pg/mL (ref 0.0–100.0)

## 2020-09-10 LAB — AMMONIA: Ammonia: 34 umol/L (ref 9–35)

## 2020-09-10 LAB — CREATININE, SERUM
Creatinine, Ser: 1.23 mg/dL (ref 0.61–1.24)
GFR, Estimated: >60 mL/min (ref >60)

## 2020-09-10 LAB — CK
Total CK: 434 U/L — ABNORMAL HIGH (ref 49–397)
Total CK: 470 U/L — ABNORMAL HIGH (ref 49–397)

## 2020-09-10 LAB — ACETAMINOPHEN LEVEL: Acetaminophen (Tylenol), Serum: <10 ug/mL — ABNORMAL LOW (ref 10–30)

## 2020-09-10 MED ORDER — IOHEXOL 300 MG/ML  SOLN
100.0000 mL | Freq: Once | INTRAMUSCULAR | Status: AC | PRN
  Administered 2020-09-10: 100 mL via INTRAVENOUS

## 2020-09-10 MED ORDER — ALBUTEROL SULFATE HFA 108 (90 BASE) MCG/ACT IN AERS
2.0000 | INHALATION_SPRAY | Freq: Once | RESPIRATORY_TRACT | Status: AC
  Administered 2020-09-10: 2 via RESPIRATORY_TRACT
  Filled 2020-09-10: qty 6.7

## 2020-09-10 MED ORDER — ENOXAPARIN SODIUM 40 MG/0.4ML ~~LOC~~ SOLN
40.0000 mg | SUBCUTANEOUS | Status: DC
  Administered 2020-09-11 – 2020-09-12 (×2): 40 mg via SUBCUTANEOUS
  Filled 2020-09-10 (×2): qty 0.4

## 2020-09-10 MED ORDER — POTASSIUM CHLORIDE 10 MEQ/100ML IV SOLN
10.0000 meq | INTRAVENOUS | Status: AC
  Administered 2020-09-10 (×2): 10 meq via INTRAVENOUS
  Filled 2020-09-10 (×2): qty 100

## 2020-09-10 MED ORDER — FUROSEMIDE 10 MG/ML IJ SOLN: 40.0000 mg | Freq: Once | INTRAMUSCULAR | Status: DC

## 2020-09-10 MED ORDER — DEXAMETHASONE SODIUM PHOSPHATE 10 MG/ML IJ SOLN
10.0000 mg | Freq: Once | INTRAMUSCULAR | Status: AC
  Administered 2020-09-10: 10 mg via INTRAVENOUS
  Filled 2020-09-10: qty 1

## 2020-09-10 MED ORDER — SODIUM CHLORIDE 0.9 % IV SOLN: INTRAVENOUS | Status: AC

## 2020-09-10 MED ORDER — AEROCHAMBER PLUS FLO-VU LARGE MISC
Status: AC
  Filled 2020-09-10: qty 1

## 2020-09-10 MED ORDER — POTASSIUM CHLORIDE 10 MEQ/100ML IV SOLN
10.0000 meq | Freq: Once | INTRAVENOUS | Status: DC
  Administered 2020-09-10: 10 meq via INTRAVENOUS
  Filled 2020-09-10: qty 100

## 2020-09-10 NOTE — ED Notes (Signed)
Infusion paused, pt transported to MRI via stretcher

## 2020-09-10 NOTE — H&P (Addendum)
History and Physical    Logan Dixon PQZ:[id number] DOB: [birth year] DOA: 09/10/2020  PCP: Patient, No Pcp Per  Patient coming from: Home.  History obtained from ER physician and patient's girlfriend.  Chief Complaint: Fall.  HPI: Logan Dixon is a 49 y.o. male with history of polysubstance abuse including cocaine, amphetamine and alcohol fall at home when he was talking to his family member on the driveway.  Per the family patient just fell backwards and hit the back of the head.  At the time of his fall he was intoxicated and has not awaken for at least 10 minutes.  Did not have any seizure-like activities.  Patient was brought to the ER.  On the way to the ER patient was given Narcan twice.  ED Course: In the ER patient had CT head C-spine and CT chest abdomen pelvis which did not show any acute except for atelectasis.  Trauma service was consulted and plan was to discharge home.  But since admission to the ER patient has become lethargic obtunded and hypoxic.  Patient had to be placed on 2 L oxygen at the time of my exam patient has to be called multiple times to be made alert.  EKG was showing nonspecific changes.  Labs were showing potassium of 5.3 creatinine 1.4 CK level of 434 WBC count of 14.7.  Lactic acid normal.  Patient went for further work-up of acute encephalopathy/lethargy and hypoxia.  Review of Systems: As per HPI, rest all negative.   History reviewed. No pertinent past medical history.  History reviewed. No pertinent surgical history.   reports that he has been smoking. He has never used smokeless tobacco. He reports current alcohol use. He reports current drug use.  Not on File  Family History  Family history unknown: Yes    Prior to Admission medications   Not on File    Physical Exam: Constitutional: Moderately built and nourished. Vitals:   09/10/20 1924 09/10/20 1930 09/10/20 2115 09/10/20 2211  BP:  140/81 136/78 127/81  Pulse:  96 89 94    Resp:  15 13 16   Temp:      TempSrc:      SpO2: 98% 95% 98% 100%  Weight:      Height:       Eyes: Anicteric no pallor. ENMT: Laceration of the scalp. Neck: No neck rigidity. Respiratory: No rhonchi or crepitations. Cardiovascular: S1-S2 heard. Abdomen: Soft nontender bowel sounds present. Musculoskeletal: No edema. Skin: No rash. Neurologic: Patient is lethargic arousable follows commands moves all extremities pupils are equal reacting to light. Psychiatric: Lethargic.   Labs on Admission: I have personally reviewed following labs and imaging studies  CBC: Recent Labs  Lab 09/10/20 0257 09/10/20 1744 09/10/20 1833 09/10/20 2210  WBC 12.3*  --   --  14.7*  NEUTROABS  --   --   --  13.5*  HGB 14.3  14.2 15.6 15.6 14.2  HCT 42.0  43.7 46.0 46.0 44.2  MCV 89.7  --   --  90.9  PLT 314  --   --  272   Basic Metabolic Panel: Recent Labs  Lab 09/10/20 0257 09/10/20 1642 09/10/20 1744 09/10/20 1833  NA 137  138 138 139 138  K 2.8*  2.9* 5.3* 5.4* 5.3*  CL 101  100 101  --   --   CO2 25 28  --   --   GLUCOSE 179*  183* 123*  --   --   BUN 9  9 12  --   --   CREATININE 1.47*  1.60* 1.46*  --   --   CALCIUM 8.6* 9.0  --   --    GFR: Estimated Creatinine Clearance: 76.8 mL/min (A) (by C-G formula based on SCr of 1.46 mg/dL (H)). Liver Function Tests: Recent Labs  Lab 09/10/20 0257  AST 34  ALT 28  ALKPHOS 63  BILITOT 1.1  PROT 7.1  ALBUMIN 3.8   No results for input(s): LIPASE, AMYLASE in the last 168 hours. No results for input(s): AMMONIA in the last 168 hours. Coagulation Profile: Recent Labs  Lab 09/10/20 0257  INR 1.0   Cardiac Enzymes: Recent Labs  Lab 09/10/20 1642  CKTOTAL 434*   BNP (last 3 results) No results for input(s): PROBNP in the last 8760 hours. HbA1C: No results for input(s): HGBA1C in the last 72 hours. CBG: Recent Labs  Lab 09/10/20 1529 09/10/20 2121  GLUCAP 119* 123*   Lipid Profile: No results for  input(s): CHOL, HDL, LDLCALC, TRIG, CHOLHDL, LDLDIRECT in the last 72 hours. Thyroid Function Tests: Recent Labs    09/10/20 1642  TSH 0.626   Anemia Panel: No results for input(s): VITAMINB12, FOLATE, FERRITIN, TIBC, IRON, RETICCTPCT in the last 72 hours. Urine analysis: No results found for: COLORURINE, APPEARANCEUR, LABSPEC, PHURINE, GLUCOSEU, HGBUR, BILIRUBINUR, KETONESUR, PROTEINUR, UROBILINOGEN, NITRITE, LEUKOCYTESUR Sepsis Labs: @LABRCNTIP (procalcitonin:4,lacticidven:4) ) Recent Results (from the past 240 hour(s))  Respiratory Panel by RT PCR (Flu A&B, Covid) - Nasopharyngeal Swab     Status: None   Collection Time: 09/10/20  4:24 PM   Specimen: Nasopharyngeal Swab  Result Value Ref Range Status   SARS Coronavirus 2 by RT PCR NEGATIVE NEGATIVE Final    Comment: (NOTE) SARS-CoV-2 target nucleic acids are NOT DETECTED.  The SARS-CoV-2 RNA is generally detectable in upper respiratoy specimens during the acute phase of infection. The lowest concentration of SARS-CoV-2 viral copies this assay can detect is 131 copies/mL. A negative result does not preclude SARS-Cov-2 infection and should not be used as the sole basis for treatment or other patient management decisions. A negative result may occur with  improper specimen collection/handling, submission of specimen other than nasopharyngeal swab, presence of viral mutation(s) within the areas targeted by this assay, and inadequate number of viral copies (<131 copies/mL). A negative result must be combined with clinical observations, patient history, and epidemiological information. The expected result is Negative.  Fact Sheet for Patients:  https://www.moore.com/https://www.fda.gov/media/142436/download  Fact Sheet for Healthcare Providers:  https://www.young.biz/https://www.fda.gov/media/142435/download  This test is no t yet approved or cleared by the [place] FDA and  has been authorized for detection and/or diagnosis of SARS-CoV-2 by FDA under an Emergency  Use Authorization (EUA). This EUA will remain  in effect (meaning this test can be used) for the duration of the COVID-19 declaration under Section 564(b)(1) of the Act, 21 U.S.C. section 360bbb-3(b)(1), unless the authorization is terminated or revoked sooner.     Influenza A by PCR NEGATIVE NEGATIVE Final   Influenza B by PCR NEGATIVE NEGATIVE Final    Comment: (NOTE) The Xpert Xpress SARS-CoV-2/FLU/RSV assay is intended as an aid in  the diagnosis of influenza from Nasopharyngeal swab specimens and  should not be used as a sole basis for treatment. Nasal washings and  aspirates are unacceptable for Xpert Xpress SARS-CoV-2/FLU/RSV  testing.  Fact Sheet for Patients: https://www.moore.com/https://www.fda.gov/media/142436/download  Fact Sheet for Healthcare Providers: https://www.young.biz/https://www.fda.gov/media/142435/download  This test is not yet approved or cleared by the [place] FDA and  has been authorized for detection and/or diagnosis of SARS-CoV-2 by  FDA under an Emergency Use Authorization (EUA). This EUA will remain  in effect (meaning this test can be used) for the duration of the  Covid-19 declaration under Section 564(b)(1) of the Act, 21  U.S.C. section 360bbb-3(b)(1), unless the authorization is  terminated or revoked. Performed at [facility], [address], [place], [place] [address]      Radiological Exams on Admission: DG Chest 2 View  Result Date: 09/10/2020 CLINICAL DATA:  Hypoxia EXAM: CHEST - 2 VIEW COMPARISON:  Chest radiograph performed the same day. FINDINGS: The heart size and mediastinal contours are within normal limits. The lung volumes are low. Bibasilar atelectasis/airspace disease appears increased from earlier today. There is no pleural effusion or pneumothorax the visualized skeletal structures are unremarkable. IMPRESSION: Increased bibasilar atelectasis/airspace disease. Electronically Signed   By: Romona Curls M.D.   On: 09/10/2020 17:09   CT HEAD WO  CONTRAST  Result Date: 09/10/2020 CLINICAL DATA:  Status post trauma. EXAM: CT HEAD WITHOUT CONTRAST TECHNIQUE: Contiguous axial images were obtained from the base of the skull through the vertex without intravenous contrast. COMPARISON:  None. FINDINGS: Brain: No evidence of acute infarction, hemorrhage, hydrocephalus, extra-axial collection or mass lesion/mass effect. Vascular: No hyperdense vessel or unexpected calcification. Skull: Normal. Negative for fracture or focal lesion. Sinuses/Orbits: No acute finding. Other: Mild scalp soft tissue swelling is seen along the posterior aspect of the vertex, to the right of midline. IMPRESSION: 1. No acute intracranial abnormality. Electronically Signed   By: Aram Candela M.D.   On: 09/10/2020 03:19   CT Cervical Spine Wo Contrast  Result Date: 09/10/2020 CLINICAL DATA:  Status post trauma. EXAM: CT CERVICAL SPINE WITHOUT CONTRAST TECHNIQUE: Multidetector CT imaging of the cervical spine was performed without intravenous contrast. Multiplanar CT image reconstructions were also generated. COMPARISON:  None. FINDINGS: Alignment: Normal. Skull base and vertebrae: No acute fracture. No primary bone lesion or focal pathologic process. Soft tissues and spinal canal: No prevertebral fluid or swelling. No visible canal hematoma. Disc levels: Normal multilevel endplates are seen with normal multilevel intervertebral disc spaces. Normal bilateral multilevel facet joints are present. Upper chest: Negative. Other: None. IMPRESSION: 1. No acute fracture or subluxation of the cervical spine. Electronically Signed   By: Aram Candela M.D.   On: 09/10/2020 03:21   DG Pelvis Portable  Result Date: 09/10/2020 CLINICAL DATA:  Level 1 trauma.  Unresponsive. EXAM: PORTABLE PELVIS 1-2 VIEWS COMPARISON:  None. FINDINGS: There is no evidence of pelvic fracture or diastasis. No pelvic bone lesions are seen. IMPRESSION: Negative. Electronically Signed   By: Helyn Numbers MD    On: 09/10/2020 03:29   CT CHEST ABDOMEN PELVIS W CONTRAST  Result Date: 09/10/2020 CLINICAL DATA:  Status post trauma. EXAM: CT CHEST, ABDOMEN, AND PELVIS WITH CONTRAST TECHNIQUE: Multidetector CT imaging of the chest, abdomen and pelvis was performed following the standard protocol during bolus administration of intravenous contrast. CONTRAST:  OMNIPAQUE IOHEXOL 300 MG/ML  SOLN COMPARISON:  None. FINDINGS: CT CHEST FINDINGS Cardiovascular: No significant vascular findings. Normal heart size. No pericardial effusion. Mediastinum/Nodes: No enlarged mediastinal, hilar, or axillary lymph nodes. Thyroid gland, trachea, and esophagus demonstrate no significant findings. Lungs/Pleura: Moderate severity atelectatic changes are seen within the bilateral lower lobes. There is no evidence of a pleural effusion or pneumothorax. Musculoskeletal: No chest wall mass or suspicious bone lesions identified. CT ABDOMEN PELVIS FINDINGS Hepatobiliary: No focal liver abnormality is seen.  No gallstones, gallbladder wall thickening, or biliary dilatation. Pancreas: Unremarkable. No pancreatic ductal dilatation or surrounding inflammatory changes. Spleen: Normal in size without focal abnormality. Adrenals/Urinary Tract: Adrenal glands are unremarkable. Kidneys are normal, without renal calculi, focal lesion, or hydronephrosis. Bladder is unremarkable. Stomach/Bowel: Stomach is within normal limits. Appendix appears normal. No evidence of bowel wall thickening, distention, or inflammatory changes. Vascular/Lymphatic: No significant vascular findings are present. No enlarged abdominal or pelvic lymph nodes. Reproductive: Prostate is unremarkable. Other: No abdominal wall hernia or abnormality. No abdominopelvic ascites. Musculoskeletal: No acute or significant osseous findings. IMPRESSION: 1. Moderate severity bilateral lower lobe atelectasis. 2. No evidence of acute traumatic injury within the chest, abdomen or pelvis.  Electronically Signed   By: Aram Candela M.D.   On: 09/10/2020 03:18   DG Chest Port 1 View  Result Date: 09/10/2020 CLINICAL DATA:  Level 1 trauma, unresponsive EXAM: PORTABLE CHEST 1 VIEW COMPARISON:  None. FINDINGS: Supine chest radiograph. Lung volumes are small. Patchy infiltrate noted within the right mid lung zone and left upper lobe may be infectious or inflammatory in nature. No pneumothorax or pleural effusion. Cardiac size within normal limits. Borderline mediastinal widening likely relates to supine positioning. No acute bone abnormality. IMPRESSION: Pulmonary hypoinflation. Superimposed patchy asymmetric pulmonary infiltrate, possibly infectious or inflammatory. Electronically Signed   By: Helyn Numbers MD   On: 09/10/2020 03:28    EKG: Independently reviewed.  Normal sinus rhythm with nonspecific ST changes.  Assessment/Plan Principal Problem:   Acute respiratory failure (HCC) Active Problems:   Acute encephalopathy   Polysubstance abuse (HCC)   Acute respiratory failure with hypoxia (HCC)    1. Acute respiratory failure with hypoxia at this time requiring 2 L oxygen to maintain sats likely from atelectasis.  Could also be from polysubstance abuse.  Closely monitor.  Will check ABG.  At this time patient is protecting his airway.  We will keep him n.p.o. until patient is more alert awake.  Repeat chest x-ray. 2. Acute encephalopathy/lethargy could be from drug abuse or from concussion.  Will get MRI brain check ammonia levels thiamine levels.  Do not think patient is having any active seizures because he is easily arousable and following commands. 3. Acute renal failure no old labs to compare we will gently hydrate. 4. Polysubstance abuse for now we will keep patient on thiamine once patient is more alert awake will need counseling and also CIWA protocol.  Addendum -MRI of the brain shows punctate occipital infarct for which I have consulted on-call neurologist.  We will  keep patient on neurochecks patient did pass swallow evaluation.  Will follow neurology recommendations.   DVT prophylaxis: Lovenox. Code Status: Full code. Family Communication: Patient's girlfriend is at bedside. Disposition Plan: To be determined. Consults called: Neurologist. Admission status: Observation.   Eduard Clos MD [facility] Pager [phone].  If 7PM-7AM, please contact night-coverage [email or web address] Password [facility]  09/10/2020, 10:38 PM

## 2020-09-10 NOTE — ED Notes (Signed)
Pt transitioned from 10L [place] to 6L [place], tolerating well

## 2020-09-10 NOTE — ED Provider Notes (Signed)
[facility] EMERGENCY DEPARTMENT Provider Note   CSN: [id number] Arrival date & time: 09/10/20  0236     History Chief Complaint  Patient presents with  . Fall    Logan Dixon is a 49 y.o. male.  Patient brought to the emergency department as a level I trauma.  Patient was found by family lying in the driveway outside.  He has a laceration on the back of his scalp.  Family reports that he had been drinking alcohol and possibly did cocaine and fentanyl tonight.  Patient with depressed respiratory rate and oxygen saturations upon EMS arrival.  He improved after Narcan.  At arrival he is somnolent but does awaken to voice.  Level 5 caveat due to mental status changes and acuity.        No past medical history on file.  There are no problems to display for this patient.        No family history on file.  Social History   Tobacco Use  . Smoking status: Not on file  Substance Use Topics  . Alcohol use: Not on file  . Drug use: Not on file    Home Medications Prior to Admission medications   Not on File    Allergies    Patient has no allergy information on record.  Review of Systems   Review of Systems  Unable to perform ROS: Acuity of condition    Physical Exam Updated Vital Signs BP (!) 156/76   Pulse (!) 103   Temp (!) 96.1 F (35.6 C) (Temporal)   Resp 16   SpO2 96%   Physical Exam Vitals and nursing note reviewed.  Constitutional:      Appearance: Normal appearance. He is well-developed.  HENT:     Head: Normocephalic. Laceration (posterior scalp) present.     Right Ear: Hearing normal.     Left Ear: Hearing normal.     Nose: Nose normal.  Eyes:     Conjunctiva/sclera: Conjunctivae normal.     Pupils: Pupils are equal, round, and reactive to light.     Comments: Pupils 1-48mm bilat   Cardiovascular:     Rate and Rhythm: Regular rhythm.     Heart sounds: S1 normal and S2 normal. No murmur heard.  No friction rub. No  gallop.   Pulmonary:     Effort: Pulmonary effort is normal. No respiratory distress.     Breath sounds: Normal breath sounds.  Chest:     Chest wall: No tenderness.  Abdominal:     General: Bowel sounds are normal.     Palpations: Abdomen is soft.     Tenderness: There is no abdominal tenderness. There is no guarding or rebound. Negative signs include Murphy's sign and McBurney's sign.     Hernia: No hernia is present.  Musculoskeletal:        General: Normal range of motion.     Cervical back: Normal range of motion and neck supple.  Skin:    General: Skin is warm and dry.     Findings: No rash.  Neurological:     Mental Status: He is confused.     GCS: GCS eye subscore is 3. GCS verbal subscore is 4. GCS motor subscore is 6.     Cranial Nerves: No cranial nerve deficit.     Sensory: No sensory deficit.     Coordination: Coordination normal.     Comments: Somnolent.  Opens eyes to voice, tries to answer questions but  falls back asleep  Psychiatric:        Speech: Speech normal.        Behavior: Behavior normal.        Thought Content: Thought content normal.     ED Results / Procedures / Treatments   Labs (all labs ordered are listed, but only abnormal results are displayed) Labs Reviewed  COMPREHENSIVE METABOLIC PANEL - Abnormal; Notable for the following components:      Result Value   Potassium 2.8 (*)    Glucose, Bld 179 (*)    Creatinine, Ser 1.47 (*)    Calcium 8.6 (*)    GFR, Estimated 58 (*)    All other components within normal limits  CBC - Abnormal; Notable for the following components:   WBC 12.3 (*)    All other components within normal limits  ETHANOL - Abnormal; Notable for the following components:   Alcohol, Ethyl (B) 86 (*)    All other components within normal limits  LACTIC ACID, PLASMA - Abnormal; Notable for the following components:   Lactic Acid, Venous 3.1 (*)    All other components within normal limits  I-STAT CHEM 8, ED - Abnormal;  Notable for the following components:   Potassium 2.9 (*)    Creatinine, Ser 1.60 (*)    Glucose, Bld 183 (*)    Calcium, Ion 1.13 (*)    All other components within normal limits  PROTIME-INR  URINALYSIS, ROUTINE W REFLEX MICROSCOPIC  SAMPLE TO BLOOD BANK    EKG None  Radiology CT HEAD WO CONTRAST  Result Date: 09/10/2020 CLINICAL DATA:  Status post trauma. EXAM: CT HEAD WITHOUT CONTRAST TECHNIQUE: Contiguous axial images were obtained from the base of the skull through the vertex without intravenous contrast. COMPARISON:  None. FINDINGS: Brain: No evidence of acute infarction, hemorrhage, hydrocephalus, extra-axial collection or mass lesion/mass effect. Vascular: No hyperdense vessel or unexpected calcification. Skull: Normal. Negative for fracture or focal lesion. Sinuses/Orbits: No acute finding. Other: Mild scalp soft tissue swelling is seen along the posterior aspect of the vertex, to the right of midline. IMPRESSION: 1. No acute intracranial abnormality. Electronically Signed   By: Aram Candela M.D.   On: 09/10/2020 03:19   CT Cervical Spine Wo Contrast  Result Date: 09/10/2020 CLINICAL DATA:  Status post trauma. EXAM: CT CERVICAL SPINE WITHOUT CONTRAST TECHNIQUE: Multidetector CT imaging of the cervical spine was performed without intravenous contrast. Multiplanar CT image reconstructions were also generated. COMPARISON:  None. FINDINGS: Alignment: Normal. Skull base and vertebrae: No acute fracture. No primary bone lesion or focal pathologic process. Soft tissues and spinal canal: No prevertebral fluid or swelling. No visible canal hematoma. Disc levels: Normal multilevel endplates are seen with normal multilevel intervertebral disc spaces. Normal bilateral multilevel facet joints are present. Upper chest: Negative. Other: None. IMPRESSION: 1. No acute fracture or subluxation of the cervical spine. Electronically Signed   By: Aram Candela M.D.   On: 09/10/2020 03:21   DG  Pelvis Portable  Result Date: 09/10/2020 CLINICAL DATA:  Level 1 trauma.  Unresponsive. EXAM: PORTABLE PELVIS 1-2 VIEWS COMPARISON:  None. FINDINGS: There is no evidence of pelvic fracture or diastasis. No pelvic bone lesions are seen. IMPRESSION: Negative. Electronically Signed   By: Helyn Numbers MD   On: 09/10/2020 03:29   CT CHEST ABDOMEN PELVIS W CONTRAST  Result Date: 09/10/2020 CLINICAL DATA:  Status post trauma. EXAM: CT CHEST, ABDOMEN, AND PELVIS WITH CONTRAST TECHNIQUE: Multidetector CT imaging of the chest, abdomen and  pelvis was performed following the standard protocol during bolus administration of intravenous contrast. CONTRAST:  OMNIPAQUE IOHEXOL 300 MG/ML  SOLN COMPARISON:  None. FINDINGS: CT CHEST FINDINGS Cardiovascular: No significant vascular findings. Normal heart size. No pericardial effusion. Mediastinum/Nodes: No enlarged mediastinal, hilar, or axillary lymph nodes. Thyroid gland, trachea, and esophagus demonstrate no significant findings. Lungs/Pleura: Moderate severity atelectatic changes are seen within the bilateral lower lobes. There is no evidence of a pleural effusion or pneumothorax. Musculoskeletal: No chest wall mass or suspicious bone lesions identified. CT ABDOMEN PELVIS FINDINGS Hepatobiliary: No focal liver abnormality is seen. No gallstones, gallbladder wall thickening, or biliary dilatation. Pancreas: Unremarkable. No pancreatic ductal dilatation or surrounding inflammatory changes. Spleen: Normal in size without focal abnormality. Adrenals/Urinary Tract: Adrenal glands are unremarkable. Kidneys are normal, without renal calculi, focal lesion, or hydronephrosis. Bladder is unremarkable. Stomach/Bowel: Stomach is within normal limits. Appendix appears normal. No evidence of bowel wall thickening, distention, or inflammatory changes. Vascular/Lymphatic: No significant vascular findings are present. No enlarged abdominal or pelvic lymph nodes. Reproductive:  Prostate is unremarkable. Other: No abdominal wall hernia or abnormality. No abdominopelvic ascites. Musculoskeletal: No acute or significant osseous findings. IMPRESSION: 1. Moderate severity bilateral lower lobe atelectasis. 2. No evidence of acute traumatic injury within the chest, abdomen or pelvis. Electronically Signed   By: Aram Candela M.D.   On: 09/10/2020 03:18   DG Chest Port 1 View  Result Date: 09/10/2020 CLINICAL DATA:  Level 1 trauma, unresponsive EXAM: PORTABLE CHEST 1 VIEW COMPARISON:  None. FINDINGS: Supine chest radiograph. Lung volumes are small. Patchy infiltrate noted within the right mid lung zone and left upper lobe may be infectious or inflammatory in nature. No pneumothorax or pleural effusion. Cardiac size within normal limits. Borderline mediastinal widening likely relates to supine positioning. No acute bone abnormality. IMPRESSION: Pulmonary hypoinflation. Superimposed patchy asymmetric pulmonary infiltrate, possibly infectious or inflammatory. Electronically Signed   By: Helyn Numbers MD   On: 09/10/2020 03:28    Procedures Procedures (including critical care time)  Medications Ordered in ED Medications  iohexol (OMNIPAQUE) 300 MG/ML solution 100 mL (100 mLs Intravenous Contrast Given 09/10/20 0302)    ED Course  I have reviewed the triage vital signs and the nursing notes.  Pertinent labs & imaging results that were available during my care of the patient were reviewed by me and considered in my medical decision making (see chart for details).    MDM Rules/Calculators/A&P                          Patient presented to the emergency department as a level 1 trauma.  Patient was found lying outside with small laceration of the posterior scalp.  Patient responded to Narcan.  At that point he admitted to drinking alcohol, using cocaine and fentanyl.  At arrival to the ER he is still very somnolent but does awaken to voice.  He underwent trauma evaluation.  CT  head, cervical spine, chest, abdomen, pelvis are unremarkable.  No other significant trauma noted on examination.  Patient will continue to be monitored here in the emergency department, discharge when sober.  Will sign out to oncoming ER physician to follow.  Final Clinical Impression(s) / ED Diagnoses Final diagnoses:  Trauma  Accidental drug overdose, initial encounter    Rx / DC Orders ED Discharge Orders    None       Maddalynn Barnard, Canary Brim, MD 09/10/20 [phone]

## 2020-09-10 NOTE — ED Triage Notes (Signed)
Pt arrives to ED BIB [facility] as a Level One Trauma. Per EMS pt was found unresponsive on a driveway by pt's family members. Per EMS pt used cocaine, fentanyl and alcohol with Hx of abuse with said substances. Per EMS the Fire Dept administered 2mg  Narcan IN and 0.5 Narcan IV was administered by EMS and assisted pt with breath by bagging pt. Per EMS Pt's sats were low and placed on 15L NRB . Pt has lac to the back of the head and cervical collar was on pt on his arrival. Pt alert to voice and painful stimuli. EDP and Trauma Surgeon at bedside upon pt's arrival.  BP 138/78 HR 118 CBG 136

## 2020-09-10 NOTE — ED Notes (Signed)
Pt noted to become hypoxic, with oxygen saturation of 83%-86%, when taken off 2L O2 multiple times. Pt awake and alert and reports feeling dizzy during time of desaturation. Placed patient back on 2L and pt currently at 93%. MD made aware.

## 2020-09-10 NOTE — ED Provider Notes (Signed)
  Physical Exam  BP 140/81   Pulse 96   Temp (!) 96.1 F (35.6 C) (Temporal)   Resp 15   Ht 5\' 9"  (1.753 m)   Wt 113.4 kg   SpO2 95%   BMI 36.92 kg/m   Physical Exam  ED Course/Procedures     Procedures  MDM  I was notified by nursing around 5 PM that patient is persistently hypoxic.  I reviewed his records and patient was seen by trauma after a fall and was noted to be altered and was on a nonrebreather.  His trauma scan is essentially unremarkable.  At this point, I plan to get a VBG and EKG and Covid test and repeat chest x-ray.  7:43 PM Repeat labs including VBG is reassuring.  UDS positive for cocaine and amphetamine.  His Covid test is negative.  His repeat chest does show increased atelectasis.  I took him off oxygen and he immediately desatted to about 83%.  There is no obvious PE has no complaints of shortness of breath. Patient is sleepy but is arousable.  I wonder if he has underlying sleep apnea.  His BNP is normal.  I talked to Dr. from trauma.  He recommend Lasix and possible echo.  He states that since patient did not have any traumatic injury, there is nothing more to offer from a trauma perspective.  Will admit to medicine for persistent hypoxia and I ordered Lasix.  CRITICAL CARE Performed by: Sheliah Hatch   Total critical care time: 30 minutes  Critical care time was exclusive of separately billable procedures and treating other patients.  Critical care was necessary to treat or prevent imminent or life-threatening deterioration.  Critical care was time spent personally by me on the following activities: development of treatment plan with patient and/or surrogate as well as nursing, discussions with consultants, evaluation of patient's response to treatment, examination of patient, obtaining history from patient or surrogate, ordering and performing treatments and interventions, ordering and review of laboratory studies, ordering and review of  radiographic studies, pulse oximetry and re-evaluation of patient's condition.     Richardean Canal, MD 09/10/20 1946

## 2020-09-10 NOTE — ED Provider Notes (Signed)
Patient was initially seen by Dr. Blinda Leatherwood.  Patient was also evaluated by the trauma service.  I have reviewed the patient's notes, imaging test and laboratory test.  He is getting a potassium infusion.  Plan is to monitor patient till he is more awake and clinically sober.  Patient examined.  Vital signs normal.  9:47 AM Currently he is somnolent and sleeping.  We will continue to monitor.  2:26 PM patient reexamined.  He is more awake and alert now.  Patient states he does have a ride home.  Posterior occiput laceration reexamined.  Laceration is not through and through.   Linwood Dibbles, MD 09/10/20 1426

## 2020-09-10 NOTE — H&P (Signed)
   TRAUMA H&P  09/10/2020, 3:00 AM   Chief Complaint: Level 1 trauma activation for low GCS  Primary Survey:  ABC's intact on arrival Arrived with c-collar in place.  The patient is an 49 y.o. male.   HPI: Unknown age male s/p fall backwards landing on his head. Report from EMS that patient is positive for EtOH, cocaine, and fentanyl. Rec'd narcan on scene and redosed en route with improvement of GCS.   No past medical history on file.  No pertinent family history.  Social History:  has no history on file for tobacco use, alcohol use, and drug use.    Allergies: Not on File  Medications: reviewed  No results found for this or any previous visit (from the past 48 hour(s)).  No results found.  ROS 10 point review of systems is negative except as listed above in HPI.  There were no vitals taken for this visit.  Secondary Survey:  GCS: E(2)//V(4)//M(6) Constitutional: well-developed, well-nourished Skull: normocephalic, laceration to back of the head Eyes: pupils equal, round, reactive to light, pinpoint b/l, moist conjunctiva Face/ENT: midface stable without deformity, normal dentition, external inspection of ears and nose normal, hearing intact Oropharynx: normal oropharyngeal mucosa, no blood Neck: no thyromegaly, trachea midline, c-collar in place on arrival, no midline cervical tenderness to palpation, no C-spine stepoffs Chest: breath sounds equal bilaterally, normal respiratory effort, no midline or lateral chest wall tenderness to palpation/deformity Abdomen: soft, NT, no bruising, no hepatosplenomegaly FAST: not performed Pelvis: stable GU: no blood at urethral meatus of penis, no scrotal masses or abnormality Back: no wounds, no T/L spine TTP, no T/L spine stepoffs Rectal: good tone, no blood Extremities: 2+  radial and pedal pulses bilaterally, motor and sensation intact to bilateral UE and LE, no peripheral edema MSK: unable to assess gait/station, no  clubbing/cyanosis of fingers/toes, normal ROM of all four extremities Skin: warm, dry, no rashes  CXR in TB: slightly wide mediastinum, otherwise unremarkable Pelvis XR in TB: unremarkable  Assessment/Plan: Problem List Fall   Plan Bilateral atelectasis - IS/pulm toilet Dispo - Discharge   Diamantina Monks, MD General and Trauma Surgery [facility]

## 2020-09-10 NOTE — ED Notes (Signed)
RES[PIRATIONS DROP TO 6 THEN COME BACK UP WHEN THE PT IS STIMULATED VITALS STABLE OTHERWISE

## 2020-09-10 NOTE — ED Notes (Signed)
Please provide fiance and sister with any updates Logan Dixon [phone].

## 2020-09-11 ENCOUNTER — Encounter (HOSPITAL_COMMUNITY): Payer: Self-pay | Admitting: Internal Medicine

## 2020-09-11 ENCOUNTER — Observation Stay (HOSPITAL_BASED_OUTPATIENT_CLINIC_OR_DEPARTMENT_OTHER): Payer: Commercial Managed Care - PPO

## 2020-09-11 ENCOUNTER — Observation Stay (HOSPITAL_COMMUNITY): Payer: Commercial Managed Care - PPO

## 2020-09-11 DIAGNOSIS — R0902 Hypoxemia: Secondary | ICD-10-CM

## 2020-09-11 DIAGNOSIS — I63431 Cerebral infarction due to embolism of right posterior cerebral artery: Secondary | ICD-10-CM | POA: Diagnosis not present

## 2020-09-11 DIAGNOSIS — T50901A Poisoning by unspecified drugs, medicaments and biological substances, accidental (unintentional), initial encounter: Secondary | ICD-10-CM

## 2020-09-11 DIAGNOSIS — I6389 Other cerebral infarction: Secondary | ICD-10-CM

## 2020-09-11 DIAGNOSIS — J9601 Acute respiratory failure with hypoxia: Secondary | ICD-10-CM | POA: Diagnosis not present

## 2020-09-11 DIAGNOSIS — T1490XA Injury, unspecified, initial encounter: Secondary | ICD-10-CM

## 2020-09-11 DIAGNOSIS — F1910 Other psychoactive substance abuse, uncomplicated: Secondary | ICD-10-CM | POA: Diagnosis not present

## 2020-09-11 DIAGNOSIS — I6330 Cerebral infarction due to thrombosis of unspecified cerebral artery: Secondary | ICD-10-CM | POA: Insufficient documentation

## 2020-09-11 DIAGNOSIS — G9340 Encephalopathy, unspecified: Secondary | ICD-10-CM | POA: Diagnosis not present

## 2020-09-11 LAB — LIPID PANEL
Cholesterol: 200 mg/dL (ref 0–200)
HDL: 55 mg/dL (ref >40)
LDL Cholesterol: 131 mg/dL — ABNORMAL HIGH (ref 0–99)
Total CHOL/HDL Ratio: 3.6 RATIO
Triglycerides: 70 mg/dL (ref <150)
VLDL: 14 mg/dL (ref 0–40)

## 2020-09-11 LAB — HIV ANTIBODY (ROUTINE TESTING W REFLEX): HIV Screen 4th Generation wRfx: NONREACTIVE

## 2020-09-11 LAB — ECHOCARDIOGRAM COMPLETE
Area-P 1/2: 3.77 cm2
Height: 69 in
S' Lateral: 3.2 cm
Weight: 4000 oz

## 2020-09-11 LAB — HEMOGLOBIN A1C
Hgb A1c MFr Bld: 5.6 % (ref 4.8–5.6)
Mean Plasma Glucose: 114.02 mg/dL

## 2020-09-11 LAB — GLUCOSE, CAPILLARY
Glucose-Capillary: 88 mg/dL (ref 70–99)
Glucose-Capillary: 113 mg/dL — ABNORMAL HIGH (ref 70–99)

## 2020-09-11 LAB — CBG MONITORING, ED
Glucose-Capillary: 120 mg/dL — ABNORMAL HIGH (ref 70–99)
Glucose-Capillary: 134 mg/dL — ABNORMAL HIGH (ref 70–99)

## 2020-09-11 MED ORDER — THIAMINE HCL 100 MG/ML IJ SOLN
100.0000 mg | Freq: Every day | INTRAMUSCULAR | Status: DC
  Administered 2020-09-11: 100 mg via INTRAVENOUS
  Filled 2020-09-11: qty 2

## 2020-09-11 MED ORDER — THIAMINE HCL 100 MG PO TABS: 100.0000 mg | ORAL_TABLET | Freq: Every day | ORAL | Status: DC

## 2020-09-11 MED ORDER — ASPIRIN EC 81 MG PO TBEC
81.0000 mg | DELAYED_RELEASE_TABLET | Freq: Every day | ORAL | Status: DC
  Administered 2020-09-12: 81 mg via ORAL
  Filled 2020-09-11: qty 1

## 2020-09-11 MED ORDER — ADULT MULTIVITAMIN W/MINERALS CH
1.0000 | ORAL_TABLET | Freq: Every day | ORAL | Status: DC
  Administered 2020-09-12: 1 via ORAL
  Filled 2020-09-11: qty 1

## 2020-09-11 MED ORDER — ASPIRIN EC 81 MG PO TBEC: 81.0000 mg | DELAYED_RELEASE_TABLET | Freq: Every day | ORAL | Status: DC

## 2020-09-11 MED ORDER — ATORVASTATIN CALCIUM 40 MG PO TABS
40.0000 mg | ORAL_TABLET | Freq: Every day | ORAL | Status: DC
  Administered 2020-09-11 – 2020-09-12 (×2): 40 mg via ORAL
  Filled 2020-09-11 (×2): qty 1

## 2020-09-11 MED ORDER — FOLIC ACID 1 MG PO TABS
1.0000 mg | ORAL_TABLET | Freq: Every day | ORAL | Status: DC
  Administered 2020-09-12: 1 mg via ORAL
  Filled 2020-09-11: qty 1

## 2020-09-11 MED ORDER — LORAZEPAM 1 MG PO TABS: 1.0000 mg | ORAL_TABLET | ORAL | Status: DC | PRN

## 2020-09-11 MED ORDER — LORAZEPAM 2 MG/ML IJ SOLN: 1.0000 mg | INTRAMUSCULAR | Status: DC | PRN

## 2020-09-11 MED ORDER — ASPIRIN 325 MG PO TABS
325.0000 mg | ORAL_TABLET | Freq: Every day | ORAL | Status: DC
  Administered 2020-09-11: 325 mg via ORAL
  Filled 2020-09-11: qty 1

## 2020-09-11 MED ORDER — STROKE: EARLY STAGES OF RECOVERY BOOK: Freq: Once | Status: AC

## 2020-09-11 MED ORDER — THIAMINE HCL 100 MG PO TABS
100.0000 mg | ORAL_TABLET | Freq: Every day | ORAL | Status: DC
  Administered 2020-09-12: 100 mg via ORAL
  Filled 2020-09-11: qty 1

## 2020-09-11 MED ORDER — ASPIRIN 300 MG RE SUPP: 300.0000 mg | Freq: Every day | RECTAL | Status: DC

## 2020-09-11 NOTE — ED Notes (Signed)
Lunch Tray Ordered @ 1017. 

## 2020-09-11 NOTE — Consult Note (Signed)
Neurology consult H&P  CC: polysubstance and obtunded found to have acute stroke.  History is obtained from: Providers and chart review  HPI: Logan Dixon is a 49 y.o. male polysubstance abuse (amphetamine, cocaine, fentanyl, EtOH) family found him lying in the driveway with laceration to back of his scalp. Per EMS responded to Narcan and admitted to drinking alcohol, using cocaine and fentanyl.  While in ED he remained obtunded with metabolic derangement and persistently hypoxic. Serial CRXs showed increased atelectasis and MRI brain done for further evaluation revealed acute small punctate stroke in right occipital lobe.   Denies weakness/numbness/dizziness/changes in vision/hearing, no SOB or chest pain.  Labs on arrival: Lactate 3.1 K 2.8 -->5.9-->4.9 CK 434   LKW: unclear tpa given?: No, outside window IR Thrombectomy? No, Modified Rankin Scale: unknown NIHSS: 0  ROS: A complete ROS was performed and is negative except as noted in the HPI.   History reviewed. No pertinent past medical history.   Family History  Family history unknown: Yes   Social History:  reports that he has been smoking. He has never used smokeless tobacco. He reports current alcohol use. He reports current drug use.  Prior to Admission medications   Not on File    Exam: Current vital signs: BP 115/73 (BP Location: Right Arm)   Pulse 82   Temp 98.1 F (36.7 C) (Oral)   Resp 12   Ht 5\' 9"  (1.753 m)   Wt 113.4 kg   SpO2 96%   BMI 36.92 kg/m   Physical Exam  Constitutional: Appears well-developed and well-nourished.  Psych: Affect appropriate to situation Eyes: No scleral injection HENT: No OP obstrucion Head: Normocephalic.  Cardiovascular: Normal rate and regular rhythm.  Respiratory: Effort normal and breath sounds normal to anterior ascultation GI: Soft.  No distension. There is no tenderness.  Skin: WDI  Neuro: Mental Status: Patient is awake, alert, oriented to person,  place, year, and situation however no recollection of event. Patient is able to give some coherent history. No signs of aphasia or neglect Cranial Nerves: II: Visual Fields are full. Pupils are equal, round, and reactive to light.  III,IV, VI: EOMI without ptosis or diploplia.  V: Facial sensation is symmetric to temperature VII: Facial movement is symmetric.  VIII: hearing is intact to voice X: Uvula elevates symmetrically XI: Shoulder shrug is symmetric. XII: tongue is midline without atrophy or fasciculations.  Motor: Tone is normal. Bulk is normal. 5/5 strength was present in all four extremities.  Sensory: Sensation is symmetric to light touch and temperature in the arms and legs.  Deep Tendon Reflexes: 2+ and symmetric in the biceps and patellae.  Plantars: Toes are downgoing bilaterally.  Cerebellar: FNF and HKS are intact bilaterally.   I have reviewed labs in epic and the pertinent results are: EtOH 86 Cocaine (+) Amphetamine  (+)  I have reviewed the images obtained: MRI brain showed punctate acute ischemic stroke in the right occipital lobe.  Assessment: [name] is a 49 y.o. male right handed PMHx polysubstance abuse (amphetamine, cocaine, fentanyl, EtOH) found down/unresponsive minor LAC to occiput and persistently hypoxic, serial CRXs showing increased atelectasis found to have small punctate stroke in right occipital lobe very likely to be embolic and secondary to substance abuse in setting of metabolic derangement and hypoxemia now resolving.  Impression:  Acute right occipital lobe embolic stroke. Acute toxic encephalopathy. Acute respiratory failure with hypoxia. Transient hypo to hyperkalemia. Mildly elevated CK. Polysubstance abuse (amphetamine, cocaine, fentanyl, EtOH).  Plan: - Recommend vascular imaging with MRA head and neck. - Recommend TTE. - Recommend labs: HbA1c, lipid panel, TSH. - Recommend Statin if LDL > 70 - Aspirin 81mg  daily  for now as his stroke risk factors are currently unknown. - Permissive hypertension first 24 h < 220/110 for now. - Telemetry monitoring for arrhythmia. - Recommend bedside Swallow screen. - Recommend Stroke education. - Recommend PT/OT/SLP consult. - Stroke neurology will follow. - Counseled patient on use of toxic substances/illicit drugs and increase stroke/cardiac/vascular risk factors and trauma. Recommended abstention and to enter into recovery program. - Patient has children and counseled him on increased risk of addiction in offspring of parents with addiction.  Electronically signed by: Dr. Pager: [phone] 09/11/2020, 2:58 AM

## 2020-09-11 NOTE — Plan of Care (Signed)
  Problem: Education: Goal: Knowledge of General Education information will improve Description: Including pain rating scale, medication(s)/side effects and non-pharmacologic comfort measures Outcome: Not Progressing   Problem: Health Behavior/Discharge Planning: Goal: Ability to manage health-related needs will improve Outcome: Not Progressing   Problem: Clinical Measurements: Goal: Ability to maintain clinical measurements within normal limits will improve Outcome: Not Progressing Goal: Will remain free from infection Outcome: Not Progressing Goal: Diagnostic test results will improve Outcome: Not Progressing Goal: Respiratory complications will improve Outcome: Not Progressing Goal: Cardiovascular complication will be avoided Outcome: Not Progressing   Problem: Coping: Goal: Level of anxiety will decrease Outcome: Not Progressing   Problem: Elimination: Goal: Will not experience complications related to bowel motility Outcome: Not Progressing Goal: Will not experience complications related to urinary retention Outcome: Not Progressing   

## 2020-09-11 NOTE — Progress Notes (Signed)
STROKE TEAM PROGRESS NOTE   INTERVAL HISTORY Wife at bedside initially and later went out. Pt depressed mood, but no neuro deficit. Discussed about smoking cessation and substance cessation, he expressed understanding and willing to quit.   Vitals:   09/11/20 0845 09/11/20 0915 09/11/20 0930 09/11/20 1052  BP: 123/71 136/83  123/67  Pulse: 81 87 83 96  Resp: 14 16 18 18   Temp:      TempSrc:      SpO2: 97% 98% 100% 98%  Weight:      Height:       CBC:  Recent Labs  Lab 09/10/20 2210 09/10/20 2210 09/10/20 2259 09/10/20 2327  WBC 14.7*  --  14.5*  --   NEUTROABS 13.5*  --   --   --   HGB 14.2   < > 14.2 13.9  HCT 44.2   < > 44.2 41.0  MCV 90.9  --  90.6  --   PLT 272  --  267  --    < > = values in this interval not displayed.   Basic Metabolic Panel:  Recent Labs  Lab 09/10/20 1642 09/10/20 1744 09/10/20 2210 09/10/20 2259 09/10/20 2327  NA 138   < > 137  --  136  K 5.3*   < > 4.9  --  4.9  CL 101  --  101  --   --   CO2 28  --  28  --   --   GLUCOSE 123*  --  126*  --   --   BUN 12  --  13  --   --   CREATININE 1.46*   < > 1.26* 1.23  --   CALCIUM 9.0  --  9.2  --   --    < > = values in this interval not displayed.   Lipid Panel:  Recent Labs  Lab 09/11/20 1005  CHOL 200  TRIG 70  HDL 55  CHOLHDL 3.6  VLDL 14  LDLCALC 13/01/21*   HgbA1c: No results for input(s): HGBA1C in the last 168 hours. Urine Drug Screen:  Recent Labs  Lab 09/10/20 1637  LABOPIA NONE DETECTED  COCAINSCRNUR POSITIVE*  LABBENZ NONE DETECTED  AMPHETMU POSITIVE*  THCU NONE DETECTED  LABBARB NONE DETECTED    Alcohol Level  Recent Labs  Lab 09/10/20 0257  ETH 86*    IMAGING past 24 hours DG Chest 2 View  Result Date: 09/10/2020 CLINICAL DATA:  Hypoxia EXAM: CHEST - 2 VIEW COMPARISON:  Chest radiograph performed the same day. FINDINGS: The heart size and mediastinal contours are within normal limits. The lung volumes are low. Bibasilar atelectasis/airspace disease appears  increased from earlier today. There is no pleural effusion or pneumothorax the visualized skeletal structures are unremarkable. IMPRESSION: Increased bibasilar atelectasis/airspace disease. Electronically Signed   By: 09/12/2020 M.D.   On: 09/10/2020 17:09   MR ANGIO HEAD WO CONTRAST  Result Date: 09/11/2020 CLINICAL DATA:  Stroke on MRI EXAM: MRA HEAD WITHOUT CONTRAST TECHNIQUE: Angiographic images of the Circle of Willis were obtained using MRA technique without intravenous contrast. COMPARISON:  None. FINDINGS: Intracranial internal carotid arteries are patent. Middle and anterior cerebral arteries are patent. Intracranial vertebral arteries, basilar artery, posterior cerebral arteries are patent. There is no significant stenosis or aneurysm. IMPRESSION: No proximal intracranial vessel occlusion or significant stenosis. Electronically Signed   By: 13/11/2019 M.D.   On: 09/11/2020 08:11   MR BRAIN WO CONTRAST  Result Date: 09/11/2020  CLINICAL DATA:  Initial evaluation for acute altered mental status. EXAM: MRI HEAD WITHOUT CONTRAST TECHNIQUE: Multiplanar, multiecho pulse sequences of the brain and surrounding structures were obtained without intravenous contrast. COMPARISON:  Prior CT from 09/10/2020. FINDINGS: Brain: Cerebral volume within normal limits for age. Scattered patchy T2/FLAIR hyperintensity primarily involving the deep and subcortical white matter both cerebral hemispheres, nonspecific, but most commonly related to chronic microvascular ischemic disease, mild to moderate in nature. Punctate 4 mm focus of restricted diffusion seen involving the right occipital lobe, consistent with a small acute ischemic infarct (series 5, image 76). No associated hemorrhage or mass effect. No other evidence for acute or subacute ischemia. Gray-white matter differentiation otherwise maintained. No encephalomalacia to suggest chronic cortical infarction elsewhere within the brain. No other evidence for  acute or chronic intracranial hemorrhage. No mass lesion, midline shift or mass effect. No hydrocephalus or extra-axial fluid collection. Pituitary gland and suprasellar region within normal limits. Midline structures intact. Vascular: Major intracranial vascular flow voids are maintained. Skull and upper cervical spine: Craniocervical junction within normal limits. Bone marrow signal intensity normal. Posterior scalp contusion/laceration noted. Sinuses/Orbits: Globes and orbital soft tissues within normal limits. Paranasal sinuses are largely clear. Small bilateral mastoid effusions noted, of doubtful significance. Inner ear structures grossly normal. Other: None. IMPRESSION: 1. Punctate 4 mm acute ischemic nonhemorrhagic right occipital infarct. 2. No other acute intracranial abnormality. 3. Mild to moderate cerebral white matter disease, nonspecific, but most commonly related to chronic microvascular ischemic disease. 4. Posterior scalp contusion/laceration. Electronically Signed   By: Rise Mu M.D.   On: 09/11/2020 00:28   VAS US CAROTID  Result Date: 09/11/2020 Carotid Arterial Duplex Study Indications:  TIA and CVA. Risk Factors: None. Performing Technologist: Jannet Askew  Examination Guidelines: A complete evaluation includes B-mode imaging, spectral Doppler, color Doppler, and power Doppler as needed of all accessible portions of each vessel. Bilateral testing is considered an integral part of a complete examination. Limited examinations for reoccurring indications may be performed as noted.  Right Carotid Findings: +----------+--------+--------+--------+------------------+------------------+           PSV cm/sEDV cm/sStenosisPlaque DescriptionComments           +----------+--------+--------+--------+------------------+------------------+ CCA Prox  147     23                                                    +----------+--------+--------+--------+------------------+------------------+ CCA Distal103     30                                                   +----------+--------+--------+--------+------------------+------------------+ ICA Prox  74      24      1-39%                     intimal thickening +----------+--------+--------+--------+------------------+------------------+ ICA Distal93      40                                                   +----------+--------+--------+--------+------------------+------------------+ ECA       128  17                                                   +----------+--------+--------+--------+------------------+------------------+ +----------+--------+-------+--------+-------------------+           PSV cm/sEDV cmsDescribeArm Pressure (mmHG) +----------+--------+-------+--------+-------------------+ Subclavian235     17                                 +----------+--------+-------+--------+-------------------+ +---------+--------+--+--------+--+---------+ VertebralPSV cm/s63EDV cm/s20Antegrade +---------+--------+--+--------+--+---------+  Left Carotid Findings: +----------+--------+--------+--------+------------------+------------------+           PSV cm/sEDV cm/sStenosisPlaque DescriptionComments           +----------+--------+--------+--------+------------------+------------------+ CCA Prox  141     23                                                   +----------+--------+--------+--------+------------------+------------------+ CCA Distal113     29                                                   +----------+--------+--------+--------+------------------+------------------+ ICA Prox  66      26      1-39%                     intimal thickening +----------+--------+--------+--------+------------------+------------------+ ICA Distal91      37                                                    +----------+--------+--------+--------+------------------+------------------+ ECA       146     19                                                   +----------+--------+--------+--------+------------------+------------------+ +----------+--------+--------+--------+-------------------+           PSV cm/sEDV cm/sDescribeArm Pressure (mmHG) +----------+--------+--------+--------+-------------------+ Subclavian151     2                                   +----------+--------+--------+--------+-------------------+ +---------+--------+--+--------+--+---------+ VertebralPSV cm/s65EDV cm/s19Antegrade +---------+--------+--+--------+--+---------+   Summary: Right Carotid: Velocities in the right ICA are consistent with a 1-39% stenosis.                The ECA appears <50% stenosed. Left Carotid: Velocities in the left ICA are consistent with a 1-39% stenosis.               The ECA appears <50% stenosed. Vertebrals: Bilateral vertebral arteries demonstrate antegrade flow. *See table(s) above for measurements and observations.     Preliminary     PHYSICAL EXAM  Temp:  [98.1 F (36.7 C)] 98.1 F (36.7 C) (11/01 0152) Pulse Rate:  [78-97] 78 (11/01 1130)  Resp:  [9-20] 13 (11/01 1130) BP: (113-141)/(64-83) 120/74 (11/01 1130) SpO2:  [84 %-100 %] 95 % (11/01 1130)  General - Well nourished, well developed, in no apparent distress, depressed mood.  Ophthalmologic - fundi not visualized due to noncooperation.  Cardiovascular - Regular rhythm and rate.  Mental Status -  Level of arousal and orientation to time, place, and person were intact. Language including expression, naming, repetition, comprehension was assessed and found intact.  Cranial Nerves II - XII - II - Visual field intact OU. III, IV, VI - Extraocular movements intact. Pupil 2.45mm bilaterally, sluggish to light. V - Facial sensation intact bilaterally. VII - Facial movement intact bilaterally. VIII - Hearing &  vestibular intact bilaterally. X - Palate elevates symmetrically. XI - Chin turning & shoulder shrug intact bilaterally. XII - Tongue protrusion intact.  Motor Strength - The patient's strength was normal in all extremities and pronator drift was absent.  Bulk was normal and fasciculations were absent.   Motor Tone - Muscle tone was assessed at the neck and appendages and was normal.  Reflexes - The patient's reflexes were symmetrical in all extremities and he had no pathological reflexes.  Sensory - Light touch, temperature/pinprick were assessed and were symmetrical.    Coordination - The patient had normal movements in the hands with no ataxia or dysmetria.  Tremor was absent.  Gait and Station - deferred.   ASSESSMENT/PLAN Logan Dixon is a 49 y.o. male with history of polysubstance abuse, found down in driveway, presenting with obtunded w/ hypoxia. MRI showed acute stroke.   Stroke:   R punctate MCA/PCA infarct likely small vessel disease in setting of polysubstance abuse  CT head No acute abnormality.   MRI  Punctate R occipital infarct. Mild to moderate small vessel disease. Posterior scalp contusion/lac  MRA  Unremarkable   Carotid Doppler  B ICA 1-39% stenosis, VAs antegrade   2D Echo EF 55-60%  LDL 131  HgbA1c 5.6   VTE prophylaxis - Lovenox 40 mg sq daily   No antithrombotic prior to admission, now on aspirin 81 mg daily. Continue on discharge.  Therapy recommendations:  HH PT  Disposition:  pending   Acute respiratory Failure w/ Hypoxia  Requiring O2 @2L   CXR pulmonary infiltrate, atx  Management per primary team  Hyperlipidemia  Home meds:  No statin  LDL 131, goal < 70  Now on lipitor 40   Continue statin at discharge  Polysubstance Abuse  ETOH use, alcohol level 86, advised to drink no more than 1 drink(s) a day  Substance abuse - UDS:  Amphetamine POSITIVE. Cocaine POSITIVE. Patient advised to stop Cocaine use due to stroke  risk. Pt is willing to quit cocaine  Hx fentanyl abuse    Tobacco abuse  Current smoker  Smoking cessation counseling provided  Pt is willing to quit     Other Stroke Risk Factors  Obesity, Body mass index is 36.92 kg/m., BMI >/= 30 associated with increased stroke risk, recommend weight loss, diet and exercise as appropriate   Other Active Problems  Leukocytosis 14.7->14.5  AKI Cre 1.26->1.23  Hypokalemia 2.8->5.9->4.9  Hospital day # 0  Neurology will sign off. Please call with questions. Pt will follow up with stroke clinic NP at [facility] in about 4 weeks. Thanks for the consult.  [facility], MD PhD Stroke Neurology 09/11/2020 6:22 PM  To contact Stroke Continuity provider, please refer to 13/11/2019. After hours, contact General Neurology

## 2020-09-11 NOTE — Progress Notes (Signed)
PROGRESS NOTE    Logan Dixon  RUE:[id number] DOB: 09-Jul-1971 DOA: 09/10/2020 PCP: Patient, No Pcp Per    Brief Narrative:  Logan Dixon is a 49 year old male with past medical history notable for polysubstance abuse with cocaine, amphetamines, alcohol who presented to [facility] emergency department following fall with encephalopathy.  Patient fall was witnessed by family members.  Larey Seat backwards and hit the back of his head on the driveway.  At the time of his fall, patient was intoxicated and had not awaken for the past 10 minutes.  No reported seizure-like activities.  In route to the ER, patient was given Narcan twice by EMS.  In the ED, temperature 96.1, HR 114, RR 20, BP 146/78, SPO2 96% on 6 L nasal cannula.  WBC 12.3, hemoglobin 14.2, platelets 314, lactic acid 3.1.  Sodium 137, potassium 2.8, chloride 101, CO2 25, glucose 179, BUN 9, creatinine 1.47.  EtOH level 86.  TSH 0.66.  Troponin IX, 9, 9.  Tylenol level less than 10, salicylate level less than 7.0.  Chest x-ray with pulmonary hypoinflation with patchy asymmetric pulmonary infiltrates consistent with infectious versus inflammatory process.  UDS positive for cocaine, amphetamines.  Pelvis x-ray with no acute finding.  CT head/C-spine with no acute intracranial abnormality, no fracture or subluxation.  MR brain with punctate 4 mm acute ischemic right occipital infarct.  CT chest/abdomen/pelvis with moderate severity bilateral lower lobe atelectasis, otherwise no acute findings.  Patient was seen by trauma surgery and was going to be discharged home but given his waxing and waning mental status TRH was consulted for admission.  Neurology also consulted for acute ischemic stroke.   Assessment & Plan:   Principal Problem:   Acute respiratory failure (HCC) Active Problems:   Acute encephalopathy   Polysubstance abuse (HCC)   Acute respiratory failure with hypoxia (HCC)   Cerebral thrombosis with cerebral  infarction   Acute toxic metabolic encephalopathy Patient presenting from home after fall with subsequent encephalopathy.  Etiology likely secondary to substance abuse.  EtOH level elevated at 86 with UDS positive for cocaine and amphetamines.  Also patient reportedly utilizing fentanyl.  Patient responded to Narcan.  CT head with no acute intracranial abnormality, CT C-spine with no fracture or subluxation.  MRI brain with small 4 mm punctate acute ischemic right occipital infarct. --Mental status now appears to be close to his normal baseline --Continue close monitoring --Discussed need for illicit drug/EtOH cessation  Acute hypoxic respiratory failure Patient was noted to be hypoxic on EMS arrival, requiring 6 L nasal cannula to maintain SPO2 on ED arrival.  Etiology likely secondary to acute intoxication with alcohol, fentanyl, cocaine with respiratory depression.  Chest x-ray with pulmonary hypoinflation and CT chest notable for moderate severity of bilateral lower lobe atelectasis.  --Continue supplemental oxygen, titrate to maintain SPO2 greater than 92% --Incentive spirometry --Home O2 evaluation  Acute ischemic CVA Patient presenting from home following fall.  Found to have a 4 mm punctate acute ischemic right occipital infarct.  MRA head without contrast with no proximal intracranial vessel occlusion or significant stenosis.  Carotid ultrasound unrevealing.  Etiology likely secondary hypoxic episode from drug overdose.  Total cholesterol 200, HDL 55, LDL 131, triglycerides 70. --Neurology following, appreciate assistance --TTE: Pending --Hemoglobin A1c: Pending --PT/OT evaluation: Pending --Start atorvastatin 40 mg p.o. daily --Aspirin 81 mg p.o. daily --Continue monitor on telemetry  Polysubstance abuse History of cocaine, EtOH and amphetamine use.  UDS positive for cocaine, amphetamine.  Also endorses fentanyl use.  Also  alcohol level elevated 86.  Discussed with patient need  for complete cessation. --CIWAA protocol with symptom triggered Ativan --Thiamine, folic acid, multivitamin --TOC consult for substance abuse  Acute renal failure Creatinine 1.60 on admission, no previous labs for evaluation.  Etiology likely secondary to volume depletion. --Cr 1.60>1.47>1.23 --Avoid nephrotoxins, renally dose all medications --Repeat BMP in a.m.  DVT prophylaxis: Lovenox Code Status: Full code Family Communication: No family present at bedside  Disposition Plan:  Status is: Observation  The patient remains OBS appropriate and will d/c before 2 midnights.  Dispo: The patient is from: Home              Anticipated d/c is to: Home              Anticipated d/c date is: 1 day              Patient currently is not medically stable to d/c.   Consultants:   Neurosurgery  Procedures:   Carotid ultrasound  TTE  Antimicrobials:   None   Subjective: Patient seen and examined bedside, sitting at edge of bed.  Continues with some dizziness.  Wants something to drink this morning.  No other complaints or concerns at this time.  Denies headache, no chest pain, no palpitations, no shortness of breath, no abdominal pain, no fever/chills/night sweats, no nausea/vomiting/diarrhea.  No acute events overnight per nursing staff.  Objective: Vitals:   09/11/20 0845 09/11/20 0915 09/11/20 0930 09/11/20 1052  BP: 123/71 136/83  123/67  Pulse: 81 87 83 96  Resp: 14 16 18 18   Temp:      TempSrc:      SpO2: 97% 98% 100% 98%  Weight:      Height:       No intake or output data in the 24 hours ending 09/11/20 1134 Filed Weights   09/10/20 0752  Weight: 113.4 kg    Examination:  General exam: Appears calm and comfortable  HEENT: Posterior scalp laceration noted, dressing in place, PERRL, EOMI Respiratory system: Clear to auscultation. Respiratory effort normal.  3 L nasal cannula with SPO2 97% Cardiovascular system: S1 & S2 heard, RRR. No JVD, murmurs, rubs,  gallops or clicks. No pedal edema. Gastrointestinal system: Abdomen is nondistended, soft and nontender. No organomegaly or masses felt. Normal bowel sounds heard. Central nervous system: Alert and oriented. No focal neurological deficits. Extremities: Symmetric 5 x 5 power. Skin: No rashes, lesions or ulcers Psychiatry: Judgement and insight appear poor. Mood & affect appropriate.     Data Reviewed: I have personally reviewed following labs and imaging studies  CBC: Recent Labs  Lab 09/10/20 0257 09/10/20 0257 09/10/20 1744 09/10/20 1833 09/10/20 2210 09/10/20 2259 09/10/20 2327  WBC 12.3*  --   --   --  14.7* 14.5*  --   NEUTROABS  --   --   --   --  13.5*  --   --   HGB 14.3  14.2   < > 15.6 15.6 14.2 14.2 13.9  HCT 42.0  43.7   < > 46.0 46.0 44.2 44.2 41.0  MCV 89.7  --   --   --  90.9 90.6  --   PLT 314  --   --   --  272 267  --    < > = values in this interval not displayed.   Basic Metabolic Panel: Recent Labs  Lab 09/10/20 0257 09/10/20 0257 09/10/20 1642 09/10/20 1744 09/10/20 1833 09/10/20 2210 09/10/20 2259 09/10/20  2327  NA 137  138   < > 138 139 138 137  --  136  K 2.8*  2.9*   < > 5.3* 5.4* 5.3* 4.9  --  4.9  CL 101  100  --  101  --   --  101  --   --   CO2 25  --  28  --   --  28  --   --   GLUCOSE 179*  183*  --  123*  --   --  126*  --   --   BUN 9  9  --  12  --   --  13  --   --   CREATININE 1.47*  1.60*  --  1.46*  --   --  1.26* 1.23  --   CALCIUM 8.6*  --  9.0  --   --  9.2  --   --    < > = values in this interval not displayed.   GFR: Estimated Creatinine Clearance: 91.2 mL/min (by C-G formula based on SCr of 1.23 mg/dL). Liver Function Tests: Recent Labs  Lab 09/10/20 0257 09/10/20 2210  AST 34 26  ALT 28 27  ALKPHOS 63 70  BILITOT 1.1 1.6*  PROT 7.1 7.6  ALBUMIN 3.8 3.9   No results for input(s): LIPASE, AMYLASE in the last 168 hours. Recent Labs  Lab 09/10/20 2210  AMMONIA 34   Coagulation Profile: Recent Labs   Lab 09/10/20 0257  INR 1.0   Cardiac Enzymes: Recent Labs  Lab 09/10/20 1642 09/10/20 2210  CKTOTAL 434* 470*   BNP (last 3 results) No results for input(s): PROBNP in the last 8760 hours. HbA1C: No results for input(s): HGBA1C in the last 72 hours. CBG: Recent Labs  Lab 09/10/20 1529 09/10/20 2121 09/11/20 0027 09/11/20 0753  GLUCAP 119* 123* 134* 120*   Lipid Profile: Recent Labs    09/11/20 1005  CHOL 200  HDL 55  LDLCALC 131*  TRIG 70  CHOLHDL 3.6   Thyroid Function Tests: Recent Labs    09/10/20 1642  TSH 0.626   Anemia Panel: No results for input(s): VITAMINB12, FOLATE, FERRITIN, TIBC, IRON, RETICCTPCT in the last 72 hours. Sepsis Labs: Recent Labs  Lab 09/10/20 0257 09/10/20 1642 09/10/20 2210  LATICACIDVEN 3.1* 1.4 1.1    Recent Results (from the past 240 hour(s))  Respiratory Panel by RT PCR (Flu A&B, Covid) - Nasopharyngeal Swab     Status: None   Collection Time: 09/10/20  4:24 PM   Specimen: Nasopharyngeal Swab  Result Value Ref Range Status   SARS Coronavirus 2 by RT PCR NEGATIVE NEGATIVE Final    Comment: (NOTE) SARS-CoV-2 target nucleic acids are NOT DETECTED.  The SARS-CoV-2 RNA is generally detectable in upper respiratoy specimens during the acute phase of infection. The lowest concentration of SARS-CoV-2 viral copies this assay can detect is 131 copies/mL. A negative result does not preclude SARS-Cov-2 infection and should not be used as the sole basis for treatment or other patient management decisions. A negative result may occur with  improper specimen collection/handling, submission of specimen other than nasopharyngeal swab, presence of viral mutation(s) within the areas targeted by this assay, and inadequate number of viral copies (<131 copies/mL). A negative result must be combined with clinical observations, patient history, and epidemiological information. The expected result is Negative.  Fact Sheet for Patients:   https://www.moore.com/  Fact Sheet for Healthcare Providers:  https://www.young.biz/  This test is no  t yet approved or cleared by the [place] FDA and  has been authorized for detection and/or diagnosis of SARS-CoV-2 by FDA under an Emergency Use Authorization (EUA). This EUA will remain  in effect (meaning this test can be used) for the duration of the COVID-19 declaration under Section 564(b)(1) of the Act, 21 U.S.C. section 360bbb-3(b)(1), unless the authorization is terminated or revoked sooner.     Influenza A by PCR NEGATIVE NEGATIVE Final   Influenza B by PCR NEGATIVE NEGATIVE Final    Comment: (NOTE) The Xpert Xpress SARS-CoV-2/FLU/RSV assay is intended as an aid in  the diagnosis of influenza from Nasopharyngeal swab specimens and  should not be used as a sole basis for treatment. Nasal washings and  aspirates are unacceptable for Xpert Xpress SARS-CoV-2/FLU/RSV  testing.  Fact Sheet for Patients: https://www.moore.com/https://www.fda.gov/media/142436/download  Fact Sheet for Healthcare Providers: https://www.young.biz/https://www.fda.gov/media/142435/download  This test is not yet approved or cleared by the [place] FDA and  has been authorized for detection and/or diagnosis of SARS-CoV-2 by  FDA under an Emergency Use Authorization (EUA). This EUA will remain  in effect (meaning this test can be used) for the duration of the  Covid-19 declaration under Section 564(b)(1) of the Act, 21  U.S.C. section 360bbb-3(b)(1), unless the authorization is  terminated or revoked. Performed at [facility], [address] [place], [place] [address]          Radiology Studies: DG Chest 2 View  Result Date: 09/10/2020 CLINICAL DATA:  Hypoxia EXAM: CHEST - 2 VIEW COMPARISON:  Chest radiograph performed the same day. FINDINGS: The heart size and mediastinal contours are within normal limits. The lung volumes are low. Bibasilar atelectasis/airspace disease  appears increased from earlier today. There is no pleural effusion or pneumothorax the visualized skeletal structures are unremarkable. IMPRESSION: Increased bibasilar atelectasis/airspace disease. Electronically Signed   By: Romona Curlsyler  Litton M.D.   On: 09/10/2020 17:09   CT HEAD WO CONTRAST  Result Date: 09/10/2020 CLINICAL DATA:  Status post trauma. EXAM: CT HEAD WITHOUT CONTRAST TECHNIQUE: Contiguous axial images were obtained from the base of the skull through the vertex without intravenous contrast. COMPARISON:  None. FINDINGS: Brain: No evidence of acute infarction, hemorrhage, hydrocephalus, extra-axial collection or mass lesion/mass effect. Vascular: No hyperdense vessel or unexpected calcification. Skull: Normal. Negative for fracture or focal lesion. Sinuses/Orbits: No acute finding. Other: Mild scalp soft tissue swelling is seen along the posterior aspect of the vertex, to the right of midline. IMPRESSION: 1. No acute intracranial abnormality. Electronically Signed   By: Aram Candelahaddeus  Houston M.D.   On: 09/10/2020 03:19   CT Cervical Spine Wo Contrast  Result Date: 09/10/2020 CLINICAL DATA:  Status post trauma. EXAM: CT CERVICAL SPINE WITHOUT CONTRAST TECHNIQUE: Multidetector CT imaging of the cervical spine was performed without intravenous contrast. Multiplanar CT image reconstructions were also generated. COMPARISON:  None. FINDINGS: Alignment: Normal. Skull base and vertebrae: No acute fracture. No primary bone lesion or focal pathologic process. Soft tissues and spinal canal: No prevertebral fluid or swelling. No visible canal hematoma. Disc levels: Normal multilevel endplates are seen with normal multilevel intervertebral disc spaces. Normal bilateral multilevel facet joints are present. Upper chest: Negative. Other: None. IMPRESSION: 1. No acute fracture or subluxation of the cervical spine. Electronically Signed   By: Aram Candelahaddeus  Houston M.D.   On: 09/10/2020 03:21   MR ANGIO HEAD WO  CONTRAST  Result Date: 09/11/2020 CLINICAL DATA:  Stroke on MRI EXAM: MRA HEAD WITHOUT CONTRAST TECHNIQUE: Angiographic images  of the Circle of Willis were obtained using MRA technique without intravenous contrast. COMPARISON:  None. FINDINGS: Intracranial internal carotid arteries are patent. Middle and anterior cerebral arteries are patent. Intracranial vertebral arteries, basilar artery, posterior cerebral arteries are patent. There is no significant stenosis or aneurysm. IMPRESSION: No proximal intracranial vessel occlusion or significant stenosis. Electronically Signed   By: Guadlupe Spanish M.D.   On: 09/11/2020 08:11   MR BRAIN WO CONTRAST  Result Date: 09/11/2020 CLINICAL DATA:  Initial evaluation for acute altered mental status. EXAM: MRI HEAD WITHOUT CONTRAST TECHNIQUE: Multiplanar, multiecho pulse sequences of the brain and surrounding structures were obtained without intravenous contrast. COMPARISON:  Prior CT from 09/10/2020. FINDINGS: Brain: Cerebral volume within normal limits for age. Scattered patchy T2/FLAIR hyperintensity primarily involving the deep and subcortical white matter both cerebral hemispheres, nonspecific, but most commonly related to chronic microvascular ischemic disease, mild to moderate in nature. Punctate 4 mm focus of restricted diffusion seen involving the right occipital lobe, consistent with a small acute ischemic infarct (series 5, image 76). No associated hemorrhage or mass effect. No other evidence for acute or subacute ischemia. Gray-white matter differentiation otherwise maintained. No encephalomalacia to suggest chronic cortical infarction elsewhere within the brain. No other evidence for acute or chronic intracranial hemorrhage. No mass lesion, midline shift or mass effect. No hydrocephalus or extra-axial fluid collection. Pituitary gland and suprasellar region within normal limits. Midline structures intact. Vascular: Major intracranial vascular flow voids are  maintained. Skull and upper cervical spine: Craniocervical junction within normal limits. Bone marrow signal intensity normal. Posterior scalp contusion/laceration noted. Sinuses/Orbits: Globes and orbital soft tissues within normal limits. Paranasal sinuses are largely clear. Small bilateral mastoid effusions noted, of doubtful significance. Inner ear structures grossly normal. Other: None. IMPRESSION: 1. Punctate 4 mm acute ischemic nonhemorrhagic right occipital infarct. 2. No other acute intracranial abnormality. 3. Mild to moderate cerebral white matter disease, nonspecific, but most commonly related to chronic microvascular ischemic disease. 4. Posterior scalp contusion/laceration. Electronically Signed   By: Rise Mu M.D.   On: 09/11/2020 00:28   DG Pelvis Portable  Result Date: 09/10/2020 CLINICAL DATA:  Level 1 trauma.  Unresponsive. EXAM: PORTABLE PELVIS 1-2 VIEWS COMPARISON:  None. FINDINGS: There is no evidence of pelvic fracture or diastasis. No pelvic bone lesions are seen. IMPRESSION: Negative. Electronically Signed   By: Helyn Numbers MD   On: 09/10/2020 03:29   CT CHEST ABDOMEN PELVIS W CONTRAST  Result Date: 09/10/2020 CLINICAL DATA:  Status post trauma. EXAM: CT CHEST, ABDOMEN, AND PELVIS WITH CONTRAST TECHNIQUE: Multidetector CT imaging of the chest, abdomen and pelvis was performed following the standard protocol during bolus administration of intravenous contrast. CONTRAST:  OMNIPAQUE IOHEXOL 300 MG/ML  SOLN COMPARISON:  None. FINDINGS: CT CHEST FINDINGS Cardiovascular: No significant vascular findings. Normal heart size. No pericardial effusion. Mediastinum/Nodes: No enlarged mediastinal, hilar, or axillary lymph nodes. Thyroid gland, trachea, and esophagus demonstrate no significant findings. Lungs/Pleura: Moderate severity atelectatic changes are seen within the bilateral lower lobes. There is no evidence of a pleural effusion or pneumothorax. Musculoskeletal: No  chest wall mass or suspicious bone lesions identified. CT ABDOMEN PELVIS FINDINGS Hepatobiliary: No focal liver abnormality is seen. No gallstones, gallbladder wall thickening, or biliary dilatation. Pancreas: Unremarkable. No pancreatic ductal dilatation or surrounding inflammatory changes. Spleen: Normal in size without focal abnormality. Adrenals/Urinary Tract: Adrenal glands are unremarkable. Kidneys are normal, without renal calculi, focal lesion, or hydronephrosis. Bladder is unremarkable. Stomach/Bowel: Stomach is within normal limits. Appendix appears  normal. No evidence of bowel wall thickening, distention, or inflammatory changes. Vascular/Lymphatic: No significant vascular findings are present. No enlarged abdominal or pelvic lymph nodes. Reproductive: Prostate is unremarkable. Other: No abdominal wall hernia or abnormality. No abdominopelvic ascites. Musculoskeletal: No acute or significant osseous findings. IMPRESSION: 1. Moderate severity bilateral lower lobe atelectasis. 2. No evidence of acute traumatic injury within the chest, abdomen or pelvis. Electronically Signed   By: Aram Candela M.D.   On: 09/10/2020 03:18   DG Chest Port 1 View  Result Date: 09/10/2020 CLINICAL DATA:  Level 1 trauma, unresponsive EXAM: PORTABLE CHEST 1 VIEW COMPARISON:  None. FINDINGS: Supine chest radiograph. Lung volumes are small. Patchy infiltrate noted within the right mid lung zone and left upper lobe may be infectious or inflammatory in nature. No pneumothorax or pleural effusion. Cardiac size within normal limits. Borderline mediastinal widening likely relates to supine positioning. No acute bone abnormality. IMPRESSION: Pulmonary hypoinflation. Superimposed patchy asymmetric pulmonary infiltrate, possibly infectious or inflammatory. Electronically Signed   By: Helyn Numbers MD   On: 09/10/2020 03:28   VAS US CAROTID  Result Date: 09/11/2020 Carotid Arterial Duplex Study Indications:  TIA and CVA. Risk  Factors: None. Performing Technologist: Jannet Askew  Examination Guidelines: A complete evaluation includes B-mode imaging, spectral Doppler, color Doppler, and power Doppler as needed of all accessible portions of each vessel. Bilateral testing is considered an integral part of a complete examination. Limited examinations for reoccurring indications may be performed as noted.  Right Carotid Findings: +----------+--------+--------+--------+------------------+------------------+           PSV cm/sEDV cm/sStenosisPlaque DescriptionComments           +----------+--------+--------+--------+------------------+------------------+ CCA Prox  147     23                                                   +----------+--------+--------+--------+------------------+------------------+ CCA Distal103     30                                                   +----------+--------+--------+--------+------------------+------------------+ ICA Prox  74      24      1-39%                     intimal thickening +----------+--------+--------+--------+------------------+------------------+ ICA Distal93      40                                                   +----------+--------+--------+--------+------------------+------------------+ ECA       128     17                                                   +----------+--------+--------+--------+------------------+------------------+ +----------+--------+-------+--------+-------------------+           PSV cm/sEDV cmsDescribeArm Pressure (mmHG) +----------+--------+-------+--------+-------------------+ Subclavian235     17                                 +----------+--------+-------+--------+-------------------+ +---------+--------+--+--------+--+---------+  VertebralPSV cm/s63EDV cm/s20Antegrade +---------+--------+--+--------+--+---------+  Left Carotid Findings:  +----------+--------+--------+--------+------------------+------------------+           PSV cm/sEDV cm/sStenosisPlaque DescriptionComments           +----------+--------+--------+--------+------------------+------------------+ CCA Prox  141     23                                                   +----------+--------+--------+--------+------------------+------------------+ CCA Distal113     29                                                   +----------+--------+--------+--------+------------------+------------------+ ICA Prox  66      26      1-39%                     intimal thickening +----------+--------+--------+--------+------------------+------------------+ ICA Distal91      37                                                   +----------+--------+--------+--------+------------------+------------------+ ECA       146     19                                                   +----------+--------+--------+--------+------------------+------------------+ +----------+--------+--------+--------+-------------------+           PSV cm/sEDV cm/sDescribeArm Pressure (mmHG) +----------+--------+--------+--------+-------------------+ Subclavian151     2                                   +----------+--------+--------+--------+-------------------+ +---------+--------+--+--------+--+---------+ VertebralPSV cm/s65EDV cm/s19Antegrade +---------+--------+--+--------+--+---------+   Summary: Right Carotid: Velocities in the right ICA are consistent with a 1-39% stenosis.                The ECA appears <50% stenosed. Left Carotid: Velocities in the left ICA are consistent with a 1-39% stenosis.               The ECA appears <50% stenosed. Vertebrals: Bilateral vertebral arteries demonstrate antegrade flow. *See table(s) above for measurements and observations.     Preliminary         Scheduled Meds: . aspirin EC  81 mg Oral Daily  . atorvastatin  40 mg Oral  Daily  . enoxaparin (LOVENOX) injection  40 mg Subcutaneous Q24H  . thiamine injection  100 mg Intravenous Daily   Continuous Infusions: . sodium chloride 75 mL/hr at 09/10/20 2311     LOS: 0 days    Time spent: 36 minutes spent on chart review, discussion with nursing staff, consultants, updating family and interview/physical exam; more than 50% of that time was spent in counseling and/or coordination of care.    Alvira Philips [place], DO [facility] Available via Epic secure chat 7am-7pm After these hours, please refer to coverage provider listed on [email or web address] 09/11/2020, 11:34 AM

## 2020-09-11 NOTE — Plan of Care (Signed)
  Problem: Pain Managment: Goal: General experience of comfort will improve Outcome: Progressing   Problem: Safety: Goal: Ability to remain free from injury will improve Outcome: Progressing   

## 2020-09-11 NOTE — Progress Notes (Signed)
09/11/20 1046  PT Visit Information  Last PT Received On 09/11/20  Assistance Needed +1  History of Present Illness Pt is a 49 y/o male admitted after being found down. 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 PMH includes polysubstance abuse, alcohol abuse.   Precautions  Precautions Fall  Restrictions  Weight Bearing Restrictions No  Home Living  Family/patient expects to be discharged to: Private residence  Living Arrangements Spouse/significant other  Available Help at Discharge Family;Available 24 hours/day  Type of Home House  Home Access Level entry  Home Layout Two level  Alternate Level Stairs-Number of Steps 12  Alternate Level Stairs-Rails Right  Bathroom Shower/Tub Tub only;Walk-in Corporate treasurer None  Prior Function  Level of Independence Independent  Comments Still working  Communication  Communication No difficulties  Pain Assessment  Pain Assessment Faces  Faces Pain Scale 2  Pain Location headache  Pain Descriptors / Indicators Headache  Pain Intervention(s) Limited activity within patient's tolerance;Monitored during session;Repositioned  Cognition  Arousal/Alertness Awake/alert  Behavior During Therapy Flat affect  Overall Cognitive Status Impaired/Different from baseline  Area of Impairment Orientation;Memory;Safety/judgement;Awareness  Orientation Level Disoriented to;Situation  Memory Decreased short-term memory  Safety/Judgement Decreased awareness of deficits  Awareness Emergent  General Comments Pt was not aware of why he was in the hospital. Responded with very short answers and was somewhat agitated with questioning.   Upper Extremity Assessment  Upper Extremity Assessment Defer to OT evaluation  Lower Extremity Assessment  Lower Extremity Assessment Generalized weakness  Cervical / Trunk Assessment  Cervical / Trunk Assessment Normal  Bed Mobility  Overal bed mobility Needs Assistance  Bed Mobility  Supine to Sit;Sit to Supine  Supine to sit Min assist  Sit to supine Min guard  General bed mobility comments Min A for stabilizing trunk upon sitting as pt reporting increased dizziness. Checked BP, however, BP WFL. Would not keep eyes open to see if he had nystagmus.   Transfers  Overall transfer level Needs assistance  Equipment used None  Transfers Sit to/from Stand  Sit to Stand Min guard  General transfer comment Min guard for safety. Pt reporting dizziness so return to supine.   Modified Rankin (Stroke Patients Only)  Pre-Morbid Rankin Score 0  Modified Rankin 4  Balance  Overall balance assessment Needs assistance  Sitting-balance support Bilateral upper extremity supported;Feet supported  Sitting balance-Leahy Scale Poor  Sitting balance - Comments Reliant on BUE support for stabilization   Standing balance support No upper extremity supported;During functional activity  Standing balance-Leahy Scale Fair  General Comments  General comments (skin integrity, edema, etc.) Pt's sister present during session   PT - End of Session  Equipment Utilized During Treatment Gait belt  Activity Tolerance Treatment limited secondary to medical complications (Comment) (dizziness)  Patient left in bed;with call bell/phone within reach (on stretcher in ED )  Nurse Communication Mobility status  PT Assessment  PT Recommendation/Assessment Patient needs continued PT services  PT Visit Diagnosis Unsteadiness on feet (R26.81);Dizziness and giddiness (R42)  PT Problem List Decreased activity tolerance;Decreased balance;Decreased mobility;Decreased cognition;Decreased knowledge of precautions;Decreased safety awareness  PT Plan  PT Frequency (ACUTE ONLY) Min 4X/week  PT Treatment/Interventions (ACUTE ONLY) Gait training;Stair training;Functional mobility training;Therapeutic activities;Therapeutic exercise;Balance training;Patient/family education;Neuromuscular re-education  AM-PAC PT "6 Clicks"  Mobility Outcome Measure (Version 2)  Help needed turning from your back to your side while in a flat bed without using bedrails? 3  Help needed moving from lying  on your back to sitting on the side of a flat bed without using bedrails? 3  Help needed moving to and from a bed to a chair (including a wheelchair)? 3  Help needed standing up from a chair using your arms (e.g., wheelchair or bedside chair)? 3  Help needed to walk in hospital room? 2  Help needed climbing 3-5 steps with a railing?  2  6 Click Score 16  Consider Recommendation of Discharge To: Home with [facility]  PT Recommendation  Follow Up Recommendations Outpatient PT;Supervision for mobility/OOB (vestibular outpatient PT? pending progression)  PT equipment Other (comment) (TBD)  Individuals Consulted  Consulted and Agree with Results and Recommendations Patient  Acute Rehab PT Goals  Patient Stated Goal to feel better  PT Goal Formulation With patient  Time For Goal Achievement 09/25/20  Potential to Achieve Goals Fair  PT Time Calculation  PT Start Time (ACUTE ONLY) 1044  PT Stop Time (ACUTE ONLY) 1103  PT Time Calculation (min) (ACUTE ONLY) 19 min  PT General Charges  $$ ACUTE PT VISIT 1 Visit  PT Evaluation  $PT Eval Moderate Complexity 1 Mod   Pt admitted secondary to problem above with deficits above. Pt limited this session secondary to dizziness; BP WFL throughout. Pt could not keep his eyes open, so unsure if pt demonstrated nystagmus. Was able to stand at EOB with min guard A. Feel pt would benefit from vestibular PT during acute stay to help determine possible cause of dizziness. Reports girlfriend is available to assist at d/c. Will continue to follow acutely to maximize functional mobility independence and safety.   Farley Ly, PT, DPT  Acute Rehabilitation Services  Pager: [phone] Office: [phone]

## 2020-09-11 NOTE — Progress Notes (Signed)
  Echocardiogram 2D Echocardiogram has been performed.  Ommie Degeorge G Jeevan Kalla 09/11/2020, 10:17 AM

## 2020-09-11 NOTE — Progress Notes (Signed)
Carotid study completed.   See CVProc for preliminary results.   Grettell Ransdell, RDMS, RVT 

## 2020-09-11 NOTE — ED Notes (Signed)
Pt tolerating 1L [place]. Pt is placed on room air

## 2020-09-12 DIAGNOSIS — I6330 Cerebral infarction due to thrombosis of unspecified cerebral artery: Secondary | ICD-10-CM | POA: Diagnosis not present

## 2020-09-12 DIAGNOSIS — F1910 Other psychoactive substance abuse, uncomplicated: Secondary | ICD-10-CM | POA: Diagnosis not present

## 2020-09-12 DIAGNOSIS — G9340 Encephalopathy, unspecified: Secondary | ICD-10-CM | POA: Diagnosis not present

## 2020-09-12 DIAGNOSIS — J9601 Acute respiratory failure with hypoxia: Secondary | ICD-10-CM | POA: Diagnosis not present

## 2020-09-12 LAB — BASIC METABOLIC PANEL
Anion gap: 6 (ref 5–15)
BUN: 12 mg/dL (ref 6–20)
CO2: 30 mmol/L (ref 22–32)
Calcium: 8.9 mg/dL (ref 8.9–10.3)
Chloride: 100 mmol/L (ref 98–111)
Creatinine, Ser: 1.16 mg/dL (ref 0.61–1.24)
GFR, Estimated: >60 mL/min (ref >60)
Glucose, Bld: 119 mg/dL — ABNORMAL HIGH (ref 70–99)
Potassium: 3.7 mmol/L (ref 3.5–5.1)
Sodium: 136 mmol/L (ref 135–145)

## 2020-09-12 LAB — GLUCOSE, CAPILLARY
Glucose-Capillary: 116 mg/dL — ABNORMAL HIGH (ref 70–99)
Glucose-Capillary: 108 mg/dL — ABNORMAL HIGH (ref 70–99)

## 2020-09-12 LAB — MAGNESIUM: Magnesium: 2.2 mg/dL (ref 1.7–2.4)

## 2020-09-12 MED ORDER — ASPIRIN 81 MG PO TBEC: 81.0000 mg | DELAYED_RELEASE_TABLET | Freq: Every day | ORAL | 0 refills | Status: DC

## 2020-09-12 MED ORDER — ATORVASTATIN CALCIUM 40 MG PO TABS: 40.0000 mg | ORAL_TABLET | Freq: Every day | ORAL | 0 refills | Status: DC

## 2020-09-12 NOTE — Discharge Summary (Signed)
Physician Discharge Summary  Logan Dixon ZOX:[id number] DOB: 05-14-1971 DOA: 09/10/2020  PCP: Patient, No Pcp Per  Admit date: 09/10/2020 Discharge date: 09/12/2020  Admitted From: Home Disposition: Home  Recommendations for Outpatient Follow-up:  1. Follow up with PCP in 1-2 weeks 2. Follow-up with neurology, [facility] in 4 weeks 3. Started on aspirin and statin for acute CVA 4. Continue to encourage cessation of illicit drugs and alcohol 5. Recommend BMP at next PCP/specialist visit to ensure renal function remains stable  Home Health: PT Equipment/Devices: None  Discharge Condition: Stable CODE STATUS: Full code Diet recommendation: Heart healthy diet  History of present illness:  Logan Dixon is a 49 year old male with past medical history notable for polysubstance abuse with cocaine, amphetamines, alcohol who presented to [facility] emergency department following fall with encephalopathy.  Patient fall was witnessed by family members.  Logan Dixon backwards and hit the back of his head on the driveway.  At the time of his fall, patient was intoxicated and had not awaken for the past 10 minutes.  No reported seizure-like activities.  In route to the ER, patient was given Narcan twice by EMS.  In the ED, temperature 96.1, HR 114, RR 20, BP 146/78, SPO2 96% on 6 L nasal cannula.  WBC 12.3, hemoglobin 14.2, platelets 314, lactic acid 3.1.  Sodium 137, potassium 2.8, chloride 101, CO2 25, glucose 179, BUN 9, creatinine 1.47.  EtOH level 86.  TSH 0.66.  Troponin IX, 9, 9.  Tylenol level less than 10, salicylate level less than 7.0.  Chest x-ray with pulmonary hypoinflation with patchy asymmetric pulmonary infiltrates consistent with infectious versus inflammatory process.  UDS positive for cocaine, amphetamines.  Pelvis x-ray with no acute finding.  CT head/C-spine with no acute intracranial abnormality, no fracture or subluxation.  MR brain with punctate 4 mm  acute ischemic right occipital infarct.  CT chest/abdomen/pelvis with moderate severity bilateral lower lobe atelectasis, otherwise no acute findings.  Patient was seen by trauma surgery and was going to be discharged home but given his waxing and waning mental status TRH was consulted for admission.  Neurology also consulted for acute ischemic stroke.  Hospital course: Acute toxic metabolic encephalopathy Patient presenting from home after fall with subsequent encephalopathy.  Etiology likely secondary to substance abuse.  EtOH level elevated at 86 with UDS positive for cocaine and amphetamines.  Also patient reportedly utilizing fentanyl.  Patient responded to Narcan.  CT head with no acute intracranial abnormality, CT C-spine with no fracture or subluxation.  MRI brain with small 4 mm punctate acute ischemic right occipital infarct.  With supportive care and following Narcan administration, patient mental status has returned back to baseline.  Continue to discuss need for cessation of illicit drugs and alcohol.  Acute hypoxic respiratory failure: Resolved Patient was noted to be hypoxic on EMS arrival, requiring 6 L nasal cannula to maintain SPO2 on ED arrival.  Etiology likely secondary to acute intoxication with alcohol, fentanyl, cocaine with respiratory depression.  Chest x-ray with pulmonary hypoinflation and CT chest notable for moderate severity of bilateral lower lobe atelectasis.  Patient was able to be titrated off of submental oxygen once mental status has improved.  Acute ischemic CVA Patient presenting from home following fall.  Found to have a 4 mm punctate acute ischemic right occipital infarct.  MRA head without contrast with no proximal intracranial vessel occlusion or significant stenosis.  Carotid ultrasound unrevealing.  Etiology likely secondary hypoxic episode from drug overdose.  Total cholesterol 200,  HDL 55, LDL 131, triglycerides 70.  Hemoglobin A1c 5.6.  TTE with LVEF 55-6%,  mild concentric LVH, no intracardiac source of embolism.  Carotid ultrasound unrevealing.  Neurology was consulted and followed during hospital course.  Patient was started on aspirin 81 mg p.o. daily, atorvastatin 40 mg p.o. daily.  Patient was monitored on telemetry without any arrhythmias appreciated during hospitalization.  Patient was seen by PT with recommendations of outpatient therapy.  Patient has returned back to his normal baseline.  Outpatient follow-up with neurology 4 weeks.  Polysubstance abuse History of cocaine, EtOH and amphetamine use.  UDS positive for cocaine, amphetamine.  Also endorses fentanyl use.  Also alcohol level elevated 86.  Discussed with patient need for complete cessation.  Acute renal failure Creatinine 1.60 on admission, no previous labs for evaluation.  Etiology likely secondary to volume depletion.  With IV fluid hydration, patient's creatinine improved to 1.18 at time of discharge.  Recommend BMP in next PCP/specialist visit.  Discharge Diagnoses:  Active Problems:   Polysubstance abuse (HCC)   Cerebral thrombosis with cerebral infarction    Discharge Instructions  Discharge Instructions    Call MD for:  difficulty breathing, headache or visual disturbances   Complete by: As directed    Call MD for:  extreme fatigue   Complete by: As directed    Call MD for:  persistant dizziness or light-headedness   Complete by: As directed    Call MD for:  persistant nausea and vomiting   Complete by: As directed    Call MD for:  severe uncontrolled pain   Complete by: As directed    Call MD for:  temperature >100.4   Complete by: As directed    Diet - low sodium heart healthy   Complete by: As directed    Increase activity slowly   Complete by: As directed      Allergies as of 09/12/2020   No Known Allergies     Medication List    TAKE these medications   aspirin 81 MG EC tablet Take 1 tablet (81 mg total) by mouth daily. Swallow whole.    atorvastatin 40 MG tablet Commonly known as: LIPITOR Take 1 tablet (40 mg total) by mouth daily.       Follow-up Information    [facility]. Schedule an appointment as soon as possible for a visit in 4 week(s).   Specialty: Neurology Contact information: [address] [place] [address] [phone]             No Known Allergies  Consultations:  Neurology   Procedures/Studies: DG Chest 2 View  Result Date: 09/10/2020 CLINICAL DATA:  Hypoxia EXAM: CHEST - 2 VIEW COMPARISON:  Chest radiograph performed the same day. FINDINGS: The heart size and mediastinal contours are within normal limits. The lung volumes are low. Bibasilar atelectasis/airspace disease appears increased from earlier today. There is no pleural effusion or pneumothorax the visualized skeletal structures are unremarkable. IMPRESSION: Increased bibasilar atelectasis/airspace disease. Electronically Signed   By: Romona Curls M.D.   On: 09/10/2020 17:09   CT HEAD WO CONTRAST  Result Date: 09/10/2020 CLINICAL DATA:  Status post trauma. EXAM: CT HEAD WITHOUT CONTRAST TECHNIQUE: Contiguous axial images were obtained from the base of the skull through the vertex without intravenous contrast. COMPARISON:  None. FINDINGS: Brain: No evidence of acute infarction, hemorrhage, hydrocephalus, extra-axial collection or mass lesion/mass effect. Vascular: No hyperdense vessel or unexpected calcification. Skull: Normal. Negative for fracture or focal  lesion. Sinuses/Orbits: No acute finding. Other: Mild scalp soft tissue swelling is seen along the posterior aspect of the vertex, to the right of midline. IMPRESSION: 1. No acute intracranial abnormality. Electronically Signed   By: Aram Candela M.D.   On: 09/10/2020 03:19   CT Cervical Spine Wo Contrast  Result Date: 09/10/2020 CLINICAL DATA:  Status post trauma. EXAM: CT CERVICAL SPINE WITHOUT CONTRAST TECHNIQUE: Multidetector  CT imaging of the cervical spine was performed without intravenous contrast. Multiplanar CT image reconstructions were also generated. COMPARISON:  None. FINDINGS: Alignment: Normal. Skull base and vertebrae: No acute fracture. No primary bone lesion or focal pathologic process. Soft tissues and spinal canal: No prevertebral fluid or swelling. No visible canal hematoma. Disc levels: Normal multilevel endplates are seen with normal multilevel intervertebral disc spaces. Normal bilateral multilevel facet joints are present. Upper chest: Negative. Other: None. IMPRESSION: 1. No acute fracture or subluxation of the cervical spine. Electronically Signed   By: Aram Candela M.D.   On: 09/10/2020 03:21   MR ANGIO HEAD WO CONTRAST  Result Date: 09/11/2020 CLINICAL DATA:  Stroke on MRI EXAM: MRA HEAD WITHOUT CONTRAST TECHNIQUE: Angiographic images of the Circle of Willis were obtained using MRA technique without intravenous contrast. COMPARISON:  None. FINDINGS: Intracranial internal carotid arteries are patent. Middle and anterior cerebral arteries are patent. Intracranial vertebral arteries, basilar artery, posterior cerebral arteries are patent. There is no significant stenosis or aneurysm. IMPRESSION: No proximal intracranial vessel occlusion or significant stenosis. Electronically Signed   By: Guadlupe Spanish M.D.   On: 09/11/2020 08:11   MR BRAIN WO CONTRAST  Result Date: 09/11/2020 CLINICAL DATA:  Initial evaluation for acute altered mental status. EXAM: MRI HEAD WITHOUT CONTRAST TECHNIQUE: Multiplanar, multiecho pulse sequences of the brain and surrounding structures were obtained without intravenous contrast. COMPARISON:  Prior CT from 09/10/2020. FINDINGS: Brain: Cerebral volume within normal limits for age. Scattered patchy T2/FLAIR hyperintensity primarily involving the deep and subcortical white matter both cerebral hemispheres, nonspecific, but most commonly related to chronic microvascular ischemic  disease, mild to moderate in nature. Punctate 4 mm focus of restricted diffusion seen involving the right occipital lobe, consistent with a small acute ischemic infarct (series 5, image 76). No associated hemorrhage or mass effect. No other evidence for acute or subacute ischemia. Gray-white matter differentiation otherwise maintained. No encephalomalacia to suggest chronic cortical infarction elsewhere within the brain. No other evidence for acute or chronic intracranial hemorrhage. No mass lesion, midline shift or mass effect. No hydrocephalus or extra-axial fluid collection. Pituitary gland and suprasellar region within normal limits. Midline structures intact. Vascular: Major intracranial vascular flow voids are maintained. Skull and upper cervical spine: Craniocervical junction within normal limits. Bone marrow signal intensity normal. Posterior scalp contusion/laceration noted. Sinuses/Orbits: Globes and orbital soft tissues within normal limits. Paranasal sinuses are largely clear. Small bilateral mastoid effusions noted, of doubtful significance. Inner ear structures grossly normal. Other: None. IMPRESSION: 1. Punctate 4 mm acute ischemic nonhemorrhagic right occipital infarct. 2. No other acute intracranial abnormality. 3. Mild to moderate cerebral white matter disease, nonspecific, but most commonly related to chronic microvascular ischemic disease. 4. Posterior scalp contusion/laceration. Electronically Signed   By: Rise Mu M.D.   On: 09/11/2020 00:28   DG Pelvis Portable  Result Date: 09/10/2020 CLINICAL DATA:  Level 1 trauma.  Unresponsive. EXAM: PORTABLE PELVIS 1-2 VIEWS COMPARISON:  None. FINDINGS: There is no evidence of pelvic fracture or diastasis. No pelvic bone lesions are seen. IMPRESSION: Negative. Electronically Signed  By: Helyn Numbers MD   On: 09/10/2020 03:29   CT CHEST ABDOMEN PELVIS W CONTRAST  Result Date: 09/10/2020 CLINICAL DATA:  Status post trauma. EXAM: CT  CHEST, ABDOMEN, AND PELVIS WITH CONTRAST TECHNIQUE: Multidetector CT imaging of the chest, abdomen and pelvis was performed following the standard protocol during bolus administration of intravenous contrast. CONTRAST:  OMNIPAQUE IOHEXOL 300 MG/ML  SOLN COMPARISON:  None. FINDINGS: CT CHEST FINDINGS Cardiovascular: No significant vascular findings. Normal heart size. No pericardial effusion. Mediastinum/Nodes: No enlarged mediastinal, hilar, or axillary lymph nodes. Thyroid gland, trachea, and esophagus demonstrate no significant findings. Lungs/Pleura: Moderate severity atelectatic changes are seen within the bilateral lower lobes. There is no evidence of a pleural effusion or pneumothorax. Musculoskeletal: No chest wall mass or suspicious bone lesions identified. CT ABDOMEN PELVIS FINDINGS Hepatobiliary: No focal liver abnormality is seen. No gallstones, gallbladder wall thickening, or biliary dilatation. Pancreas: Unremarkable. No pancreatic ductal dilatation or surrounding inflammatory changes. Spleen: Normal in size without focal abnormality. Adrenals/Urinary Tract: Adrenal glands are unremarkable. Kidneys are normal, without renal calculi, focal lesion, or hydronephrosis. Bladder is unremarkable. Stomach/Bowel: Stomach is within normal limits. Appendix appears normal. No evidence of bowel wall thickening, distention, or inflammatory changes. Vascular/Lymphatic: No significant vascular findings are present. No enlarged abdominal or pelvic lymph nodes. Reproductive: Prostate is unremarkable. Other: No abdominal wall hernia or abnormality. No abdominopelvic ascites. Musculoskeletal: No acute or significant osseous findings. IMPRESSION: 1. Moderate severity bilateral lower lobe atelectasis. 2. No evidence of acute traumatic injury within the chest, abdomen or pelvis. Electronically Signed   By: Aram Candela M.D.   On: 09/10/2020 03:18   DG Chest Port 1 View  Result Date: 09/10/2020 CLINICAL DATA:   Level 1 trauma, unresponsive EXAM: PORTABLE CHEST 1 VIEW COMPARISON:  None. FINDINGS: Supine chest radiograph. Lung volumes are small. Patchy infiltrate noted within the right mid lung zone and left upper lobe may be infectious or inflammatory in nature. No pneumothorax or pleural effusion. Cardiac size within normal limits. Borderline mediastinal widening likely relates to supine positioning. No acute bone abnormality. IMPRESSION: Pulmonary hypoinflation. Superimposed patchy asymmetric pulmonary infiltrate, possibly infectious or inflammatory. Electronically Signed   By: Helyn Numbers MD   On: 09/10/2020 03:28   ECHOCARDIOGRAM COMPLETE  Result Date: 09/11/2020    ECHOCARDIOGRAM REPORT   Patient Name:   MIHAIL PRETTYMAN Date of Exam: 09/11/2020 Medical Rec #:  [id number]        Height:       69.0 in Accession #:    [id number]       Weight:       250.0 lb Date of Birth:  01-29-1971       BSA:          2.272 m Patient Age:    49 years         BP:           136/83 mmHg Patient Gender: M                HR:           82 bpm. Exam Location:  Inpatient Procedure: 2D Echo, Cardiac Doppler and Color Doppler Indications:    Stroke 434.91 / I163.9  History:        Patient has no prior history of Echocardiogram examinations.  Sonographer:    Elmarie Shiley Dance Referring Phys: [id number] ARSHAD N KAKRAKANDY IMPRESSIONS  1. Left ventricular ejection fraction, by estimation, is 55 to 60%. The left ventricle has normal  function. The left ventricle has no regional wall motion abnormalities. There is mild concentric left ventricular hypertrophy. Left ventricular diastolic parameters were normal.  2. Right ventricular systolic function is normal. The right ventricular size is normal.  3. The mitral valve is normal in structure. Trivial mitral valve regurgitation.  4. The aortic valve is tricuspid. Aortic valve regurgitation is not visualized.  5. The inferior vena cava is normal in size with greater than 50% respiratory variability,  suggesting right atrial pressure of 3 mmHg. Comparison(s): No prior Echocardiogram. Conclusion(s)/Recommendation(s): No intracardiac source of embolism detected on this transthoracic study. A transesophageal echocardiogram is recommended to exclude cardiac source of embolism if clinically indicated. FINDINGS  Left Ventricle: Left ventricular ejection fraction, by estimation, is 55 to 60%. The left ventricle has normal function. The left ventricle has no regional wall motion abnormalities. The left ventricular internal cavity size was normal in size. There is  mild concentric left ventricular hypertrophy. Left ventricular diastolic parameters were normal. Right Ventricle: The right ventricular size is normal. Right vetricular wall thickness was not well visualized. Right ventricular systolic function is normal. Left Atrium: Left atrial size was normal in size. Right Atrium: Right atrial size was normal in size. Pericardium: There is no evidence of pericardial effusion. Mitral Valve: The mitral valve is normal in structure. There is mild thickening of the mitral valve leaflet(s). Mild mitral annular calcification. Trivial mitral valve regurgitation. Tricuspid Valve: The tricuspid valve is normal in structure. Tricuspid valve regurgitation is trivial. Aortic Valve: The aortic valve is tricuspid. Aortic valve regurgitation is not visualized. Pulmonic Valve: The pulmonic valve was normal in structure. Pulmonic valve regurgitation is trivial. Aorta: The aortic root and ascending aorta are structurally normal, with no evidence of dilitation. Venous: The inferior vena cava is normal in size with greater than 50% respiratory variability, suggesting right atrial pressure of 3 mmHg. IAS/Shunts: No atrial level shunt detected by color flow Doppler.  LEFT VENTRICLE PLAX 2D LVIDd:         4.40 cm  Diastology LVIDs:         3.20 cm  LV e' medial:    9.68 cm/s LV PW:         1.40 cm  LV E/e' medial:  9.6 LV IVS:        1.00 cm  LV  e' lateral:   10.10 cm/s LVOT diam:     2.50 cm  LV E/e' lateral: 9.2 LV SV:         109 LV SV Index:   48 LVOT Area:     4.91 cm  RIGHT VENTRICLE             IVC RV Basal diam:  3.10 cm     IVC diam: 1.40 cm RV S prime:     13.20 cm/s TAPSE (M-mode): 2.6 cm LEFT ATRIUM             Index       RIGHT ATRIUM           Index LA diam:        3.20 cm 1.41 cm/m  RA Area:     14.60 cm LA Vol (A2C):   65.8 ml 28.97 ml/m RA Volume:   35.70 ml  15.72 ml/m LA Vol (A4C):   46.5 ml 20.47 ml/m LA Biplane Vol: 56.4 ml 24.83 ml/m  AORTIC VALVE LVOT Vmax:   100.00 cm/s LVOT Vmean:  65.200 cm/s LVOT VTI:    0.222 m  AORTA Ao Root diam: 3.50 cm Ao Asc diam:  3.30 cm MITRAL VALVE MV Area (PHT): 3.77 cm    SHUNTS MV Decel Time: 201 msec    Systemic VTI:  0.22 m MV E velocity: 93.00 cm/s  Systemic Diam: 2.50 cm MV A velocity: 68.40 cm/s MV E/A ratio:  1.36 Laurance Flatten MD Electronically signed by Laurance Flatten MD Signature Date/Time: 09/11/2020/1:58:29 PM    Final    VAS US CAROTID  Result Date: 09/11/2020 Carotid Arterial Duplex Study Indications:  TIA and CVA. Risk Factors: None. Performing Technologist: Jannet Askew  Examination Guidelines: A complete evaluation includes B-mode imaging, spectral Doppler, color Doppler, and power Doppler as needed of all accessible portions of each vessel. Bilateral testing is considered an integral part of a complete examination. Limited examinations for reoccurring indications may be performed as noted.  Right Carotid Findings: +----------+--------+--------+--------+------------------+------------------+           PSV cm/sEDV cm/sStenosisPlaque DescriptionComments           +----------+--------+--------+--------+------------------+------------------+ CCA Prox  147     23                                                   +----------+--------+--------+--------+------------------+------------------+ CCA Distal103     30                                                    +----------+--------+--------+--------+------------------+------------------+ ICA Prox  74      24      1-39%                     intimal thickening +----------+--------+--------+--------+------------------+------------------+ ICA Distal93      40                                                   +----------+--------+--------+--------+------------------+------------------+ ECA       128     17                                                   +----------+--------+--------+--------+------------------+------------------+ +----------+--------+-------+--------+-------------------+           PSV cm/sEDV cmsDescribeArm Pressure (mmHG) +----------+--------+-------+--------+-------------------+ Subclavian235     17                                 +----------+--------+-------+--------+-------------------+ +---------+--------+--+--------+--+---------+ VertebralPSV cm/s63EDV cm/s20Antegrade +---------+--------+--+--------+--+---------+  Left Carotid Findings: +----------+--------+--------+--------+------------------+------------------+           PSV cm/sEDV cm/sStenosisPlaque DescriptionComments           +----------+--------+--------+--------+------------------+------------------+ CCA Prox  141     23                                                   +----------+--------+--------+--------+------------------+------------------+  CCA Distal113     29                                                   +----------+--------+--------+--------+------------------+------------------+ ICA Prox  66      26      1-39%                     intimal thickening +----------+--------+--------+--------+------------------+------------------+ ICA Distal91      37                                                   +----------+--------+--------+--------+------------------+------------------+ ECA       146     19                                                    +----------+--------+--------+--------+------------------+------------------+ +----------+--------+--------+--------+-------------------+           PSV cm/sEDV cm/sDescribeArm Pressure (mmHG) +----------+--------+--------+--------+-------------------+ Subclavian151     2                                   +----------+--------+--------+--------+-------------------+ +---------+--------+--+--------+--+---------+ VertebralPSV cm/s65EDV cm/s19Antegrade +---------+--------+--+--------+--+---------+   Summary: Right Carotid: Velocities in the right ICA are consistent with a 1-39% stenosis.                The ECA appears <50% stenosed. Left Carotid: Velocities in the left ICA are consistent with a 1-39% stenosis.               The ECA appears <50% stenosed. Vertebrals: Bilateral vertebral arteries demonstrate antegrade flow. *See table(s) above for measurements and observations.     Preliminary       Subjective: Patient seen and examined bedside, resting comfortably.  No complaints this morning.  Appears mentation/physical ability back in his normal baseline.  Once again discussed need for complete cessation of illicit drugs and alcohol.  Patient ready for discharge home.  Denies headache, no chest pain, no palpitations, no shortness of breath, no abdominal pain, no weakness, no fatigue, no paresthesias.  No acute events overnight per nursing staff.  Discharge Exam: Vitals:   09/12/20 0526 09/12/20 0743  BP: 115/60 114/63  Pulse: 75 67  Resp:  17  Temp: 98.4 F (36.9 C) 98.3 F (36.8 C)  SpO2: 97% 100%   Vitals:   09/11/20 2022 09/11/20 2300 09/12/20 0526 09/12/20 0743  BP: 117/66 110/80 115/60 114/63  Pulse: 76 75 75 67  Resp: 17 18  17   Temp: 98.3 F (36.8 C) 98.4 F (36.9 C) 98.4 F (36.9 C) 98.3 F (36.8 C)  TempSrc: Oral Oral Oral   SpO2: 96% 97% 97% 100%  Weight:      Height:        General: Pt is alert, awake, not in acute distress Cardiovascular: RRR, S1/S2 +, no  rubs, no gallops Respiratory: CTA bilaterally, no wheezing, no rhonchi Abdominal: Soft, NT, ND, bowel sounds + Extremities: no edema, no cyanosis    The  results of significant diagnostics from this hospitalization (including imaging, microbiology, ancillary and laboratory) are listed below for reference.     Microbiology: Recent Results (from the past 240 hour(s))  Respiratory Panel by RT PCR (Flu A&B, Covid) - Nasopharyngeal Swab     Status: None   Collection Time: 09/10/20  4:24 PM   Specimen: Nasopharyngeal Swab  Result Value Ref Range Status   SARS Coronavirus 2 by RT PCR NEGATIVE NEGATIVE Final    Comment: (NOTE) SARS-CoV-2 target nucleic acids are NOT DETECTED.  The SARS-CoV-2 RNA is generally detectable in upper respiratoy specimens during the acute phase of infection. The lowest concentration of SARS-CoV-2 viral copies this assay can detect is 131 copies/mL. A negative result does not preclude SARS-Cov-2 infection and should not be used as the sole basis for treatment or other patient management decisions. A negative result may occur with  improper specimen collection/handling, submission of specimen other than nasopharyngeal swab, presence of viral mutation(s) within the areas targeted by this assay, and inadequate number of viral copies (<131 copies/mL). A negative result must be combined with clinical observations, patient history, and epidemiological information. The expected result is Negative.  Fact Sheet for Patients:  https://www.moore.com/  Fact Sheet for Healthcare Providers:  https://www.young.biz/  This test is no t yet approved or cleared by the [place] FDA and  has been authorized for detection and/or diagnosis of SARS-CoV-2 by FDA under an Emergency Use Authorization (EUA). This EUA will remain  in effect (meaning this test can be used) for the duration of the COVID-19 declaration under Section 564(b)(1) of  the Act, 21 U.S.C. section 360bbb-3(b)(1), unless the authorization is terminated or revoked sooner.     Influenza A by PCR NEGATIVE NEGATIVE Final   Influenza B by PCR NEGATIVE NEGATIVE Final    Comment: (NOTE) The Xpert Xpress SARS-CoV-2/FLU/RSV assay is intended as an aid in  the diagnosis of influenza from Nasopharyngeal swab specimens and  should not be used as a sole basis for treatment. Nasal washings and  aspirates are unacceptable for Xpert Xpress SARS-CoV-2/FLU/RSV  testing.  Fact Sheet for Patients: https://www.moore.com/  Fact Sheet for Healthcare Providers: https://www.young.biz/  This test is not yet approved or cleared by the [place] FDA and  has been authorized for detection and/or diagnosis of SARS-CoV-2 by  FDA under an Emergency Use Authorization (EUA). This EUA will remain  in effect (meaning this test can be used) for the duration of the  Covid-19 declaration under Section 564(b)(1) of the Act, 21  U.S.C. section 360bbb-3(b)(1), unless the authorization is  terminated or revoked. Performed at [facility], [address], [place], [place] [address]      Labs: BNP (last 3 results) Recent Labs    09/10/20 1641  BNP 28.7   Basic Metabolic Panel: Recent Labs  Lab 09/10/20 0257 09/10/20 0257 09/10/20 1642 09/10/20 1642 09/10/20 1744 09/10/20 1833 09/10/20 2210 09/10/20 2259 09/10/20 2327 09/12/20 0552  NA 137  138   < > 138   < > 139 138 137  --  136 136  K 2.8*  2.9*   < > 5.3*   < > 5.4* 5.3* 4.9  --  4.9 3.7  CL 101  100  --  101  --   --   --  101  --   --  100  CO2 25  --  28  --   --   --  28  --   --  30  GLUCOSE 179*  183*  --  123*  --   --   --  126*  --   --  119*  BUN 9  9  --  12  --   --   --  13  --   --  12  CREATININE 1.47*  1.60*  --  1.46*  --   --   --  1.26* 1.23  --  1.16  CALCIUM 8.6*  --  9.0  --   --   --  9.2  --   --  8.9  MG  --   --   --   --   --   --   --    --   --  2.2   < > = values in this interval not displayed.   Liver Function Tests: Recent Labs  Lab 09/10/20 0257 09/10/20 2210  AST 34 26  ALT 28 27  ALKPHOS 63 70  BILITOT 1.1 1.6*  PROT 7.1 7.6  ALBUMIN 3.8 3.9   No results for input(s): LIPASE, AMYLASE in the last 168 hours. Recent Labs  Lab 09/10/20 2210  AMMONIA 34   CBC: Recent Labs  Lab 09/10/20 0257 09/10/20 0257 09/10/20 1744 09/10/20 1833 09/10/20 2210 09/10/20 2259 09/10/20 2327  WBC 12.3*  --   --   --  14.7* 14.5*  --   NEUTROABS  --   --   --   --  13.5*  --   --   HGB 14.3  14.2   < > 15.6 15.6 14.2 14.2 13.9  HCT 42.0  43.7   < > 46.0 46.0 44.2 44.2 41.0  MCV 89.7  --   --   --  90.9 90.6  --   PLT 314  --   --   --  272 267  --    < > = values in this interval not displayed.   Cardiac Enzymes: Recent Labs  Lab 09/10/20 1642 09/10/20 2210  CKTOTAL 434* 470*   BNP: Invalid input(s): POCBNP CBG: Recent Labs  Lab 09/11/20 0753 09/11/20 1639 09/11/20 2315 09/12/20 0509 09/12/20 0805  GLUCAP 120* 88 113* 116* 108*   D-Dimer No results for input(s): DDIMER in the last 72 hours. Hgb A1c Recent Labs    09/11/20 1005  HGBA1C 5.6   Lipid Profile Recent Labs    09/11/20 1005  CHOL 200  HDL 55  LDLCALC 131*  TRIG 70  CHOLHDL 3.6   Thyroid function studies Recent Labs    09/10/20 1642  TSH 0.626   Anemia work up No results for input(s): VITAMINB12, FOLATE, FERRITIN, TIBC, IRON, RETICCTPCT in the last 72 hours. Urinalysis No results found for: COLORURINE, APPEARANCEUR, LABSPEC, PHURINE, GLUCOSEU, HGBUR, BILIRUBINUR, KETONESUR, PROTEINUR, UROBILINOGEN, NITRITE, LEUKOCYTESUR Sepsis Labs Invalid input(s): PROCALCITONIN,  WBC,  LACTICIDVEN Microbiology Recent Results (from the past 240 hour(s))  Respiratory Panel by RT PCR (Flu A&B, Covid) - Nasopharyngeal Swab     Status: None   Collection Time: 09/10/20  4:24 PM   Specimen: Nasopharyngeal Swab  Result Value Ref Range  Status   SARS Coronavirus 2 by RT PCR NEGATIVE NEGATIVE Final    Comment: (NOTE) SARS-CoV-2 target nucleic acids are NOT DETECTED.  The SARS-CoV-2 RNA is generally detectable in upper respiratoy specimens during the acute phase of infection. The lowest concentration of SARS-CoV-2 viral copies this assay can detect is 131 copies/mL. A negative result does not preclude SARS-Cov-2 infection and should  not be used as the sole basis for treatment or other patient management decisions. A negative result may occur with  improper specimen collection/handling, submission of specimen other than nasopharyngeal swab, presence of viral mutation(s) within the areas targeted by this assay, and inadequate number of viral copies (<131 copies/mL). A negative result must be combined with clinical observations, patient history, and epidemiological information. The expected result is Negative.  Fact Sheet for Patients:  https://www.moore.com/  Fact Sheet for Healthcare Providers:  https://www.young.biz/  This test is no t yet approved or cleared by the [place] FDA and  has been authorized for detection and/or diagnosis of SARS-CoV-2 by FDA under an Emergency Use Authorization (EUA). This EUA will remain  in effect (meaning this test can be used) for the duration of the COVID-19 declaration under Section 564(b)(1) of the Act, 21 U.S.C. section 360bbb-3(b)(1), unless the authorization is terminated or revoked sooner.     Influenza A by PCR NEGATIVE NEGATIVE Final   Influenza B by PCR NEGATIVE NEGATIVE Final    Comment: (NOTE) The Xpert Xpress SARS-CoV-2/FLU/RSV assay is intended as an aid in  the diagnosis of influenza from Nasopharyngeal swab specimens and  should not be used as a sole basis for treatment. Nasal washings and  aspirates are unacceptable for Xpert Xpress SARS-CoV-2/FLU/RSV  testing.  Fact Sheet for  Patients: https://www.moore.com/  Fact Sheet for Healthcare Providers: https://www.young.biz/  This test is not yet approved or cleared by the [place] FDA and  has been authorized for detection and/or diagnosis of SARS-CoV-2 by  FDA under an Emergency Use Authorization (EUA). This EUA will remain  in effect (meaning this test can be used) for the duration of the  Covid-19 declaration under Section 564(b)(1) of the Act, 21  U.S.C. section 360bbb-3(b)(1), unless the authorization is  terminated or revoked. Performed at [facility], [address], [place], [place] [address]      Time coordinating discharge: Over 30 minutes  SIGNED:   Alvira Philips [place], DO  [facility] 09/12/2020, 8:20 AM

## 2020-09-12 NOTE — Progress Notes (Signed)
Physical Therapy Treatment Patient Details Name: Logan Dixon MRN: [id number] DOB: 04/13/1971 Today's Date: 09/12/2020    History of Present Illness Pt is a 49 y/o male admitted after being found down. 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 PMH includes polysubstance abuse, alcohol abuse.     PT Comments    Pt much improved from yesterday. Completed horizontal and posterior canal BPPV testing and pt denies any dizziness with horizontal canal positioning but reports "ahhh I'm dizzy, i'm going to pass out" when coming up into long sit. Pt denies this sensation when rolling to the side and pushing self up to EOB. Pt describes his dizziness as lightheadedness. Pt denies room to be spinning and no nystagmus present. Pt was found to have an occipital infarct and fell backwards and hit his head hard on posterior side. Suspect pt with concussion in addition to infarct. Pt with noted minimal L sided weakness, instability with ambulation, drifting to L during amb and inability to ascend stairs leading with L LE. Pt with better temperament today. Pt to benefit from HHPT to progress independence with mobility and re-assess vestibular system. Acute PT to cont to follow.    Follow Up Recommendations  Supervision for mobility/OOB;Home health PT (vestibular trained)     Equipment Recommendations  Other (comment) (TBD)    Recommendations for Other Services       Precautions / Restrictions Precautions Precautions: Fall Restrictions Weight Bearing Restrictions: No    Mobility  Bed Mobility Overal bed mobility: Needs Assistance Bed Mobility: Supine to Sit;Sit to Supine     Supine to sit: Min assist Sit to supine: Min guard   General bed mobility comments: pt able to bring self up into long sit however when going to fast pt with c/o "i'm going to pass out, i'm dizzy" pt denies room spinning but reports lightheadedness. Pt then rolled to the L and pushed up from sidelying to sit EOB with  minimal dizziness compared to long sit  Transfers Overall transfer level: Needs assistance Equipment used: None Transfers: Sit to/from Stand Sit to Stand: Min guard         General transfer comment: min guard for safety, increased time, denies room spinning or dizziness  Ambulation/Gait Ambulation/Gait assistance: Min guard Gait Distance (Feet): 200 Feet Assistive device: None Gait Pattern/deviations: Step-through pattern;Decreased stride length;Staggering left Gait velocity: dec Gait velocity interpretation: 1.31 - 2.62 ft/sec, indicative of limited community ambulator General Gait Details: pt with noted L lateral sway, pt reports this to be normal, however pt with noted instability, pt denies worsening of 'dizzines"   Stairs Stairs: Yes Stairs assistance: Min guard Stair Management: One rail Right;Step to pattern Number of Stairs: 12 General stair comments: pt unable to advance up the stairs with L LE due to reported weakness, pt ascended with step to pattern and descended with reciprocal pattern   Wheelchair Mobility    Modified Rankin (Stroke Patients Only) Modified Rankin (Stroke Patients Only) Pre-Morbid Rankin Score: No symptoms Modified Rankin: Moderate disability     Balance Overall balance assessment: Needs assistance Sitting-balance support: No upper extremity supported;Feet supported Sitting balance-Leahy Scale: Fair Sitting balance - Comments: Reliant on BUE support for stabilization    Standing balance support: No upper extremity supported;During functional activity Standing balance-Leahy Scale: Fair Standing balance comment: min guard for safety                            Cognition Arousal/Alertness: Awake/alert  Behavior During Therapy: Flat affect Overall Cognitive Status: Impaired/Different from baseline Area of Impairment: Orientation;Memory;Safety/judgement;Awareness;Problem solving                 Orientation Level:  Disoriented to;Situation   Memory: Decreased short-term memory   Safety/Judgement: Decreased awareness of deficits Awareness: Emergent Problem Solving: Requires verbal cues;Slow processing General Comments: pt with flat affect, delayed processing of commands asked      Exercises      General Comments General comments (skin integrity, edema, etc.): pt laceration on posterior skull covered in dressing, appears to have had some drainage      Pertinent Vitals/Pain Pain Assessment: Faces Faces Pain Scale: Hurts a little bit Pain Location: headache Pain Descriptors / Indicators: Headache Pain Intervention(s): Monitored during session    Home Living                      Prior Function            PT Goals (current goals can now be found in the care plan section) Acute Rehab PT Goals Patient Stated Goal: to feel better Progress towards PT goals: Progressing toward goals    Frequency    Min 4X/week      PT Plan Current plan remains appropriate    Co-evaluation              AM-PAC PT "6 Clicks" Mobility   Outcome Measure  Help needed turning from your back to your side while in a flat bed without using bedrails?: A Little Help needed moving from lying on your back to sitting on the side of a flat bed without using bedrails?: A Little Help needed moving to and from a bed to a chair (including a wheelchair)?: A Little Help needed standing up from a chair using your arms (e.g., wheelchair or bedside chair)?: A Little Help needed to walk in hospital room?: A Little Help needed climbing 3-5 steps with a railing? : A Little 6 Click Score: 18    End of Session Equipment Utilized During Treatment: Gait belt Activity Tolerance: Patient tolerated treatment well (dizziness) Patient left: in bed;with call bell/phone within reach (sitting EOB with OT) Nurse Communication: Mobility status PT Visit Diagnosis: Unsteadiness on feet (R26.81);Dizziness and giddiness  (R42)     Time: 1610-9604 PT Time Calculation (min) (ACUTE ONLY): 31 min  Charges:  $Gait Training: 8-22 mins $Therapeutic Activity: 8-22 mins                     Lewis Shock, PT, DPT Acute Rehabilitation Services Pager #: [phone] Office #: [phone]    Iona Hansen 09/12/2020, 2:18 PM

## 2020-09-12 NOTE — Evaluation (Signed)
Occupational Therapy Evaluation Patient Details Name: Logan Dixon MRN: [id number] DOB: 05-Apr-1971 Today's Date: 09/12/2020    History of Present Illness Pt is a 49 y/o male admitted after being found down. 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 PMH includes polysubstance abuse, alcohol abuse.    Clinical Impression   PTA patient independent and working. Admitted for above and limited by problem list below, including lightheadedness, impaired balance, decreased cognition, headache, and decreased activity tolerance.  Patient with PT at EOB upon entry, engaged in ADLs with min guard when standing or leaning forward; seated with setup assist.  He presents with flat affect, slowed initiation and processing, and decreased problem solving; he recall the fall prior to admission but no recall of infarct.  Reviewed safety with ADL engagement, recommend seated ADLs and increased time during transitions, family support with standing/mobility activities for safety, assist for all IADLs at this time (driving, medication mgmt).  Discussed activity progression and importance of rest upon returning home. Based on performance today, believe he will benefit from further OT services while admitted and after dc at [facility] level to optimize independence and safety, return to PLOF with ADLs, mobility.     Follow Up Recommendations  Home health OT;Supervision/Assistance - 24 hour    Equipment Recommendations  3 in 1 bedside commode (for shower chair)    Recommendations for Other Services       Precautions / Restrictions Precautions Precautions: Fall Restrictions Weight Bearing Restrictions: No      Mobility Bed Mobility               General bed mobility comments: EOB with PT upon entry     Transfers Overall transfer level: Needs assistance Equipment used: None Transfers: Sit to/from Stand Sit to Stand: Min guard         General transfer comment: min guard for safety, mild  unsteadiness and reports lightheadedness     Balance Overall balance assessment: Needs assistance Sitting-balance support: No upper extremity supported;Feet supported Sitting balance-Leahy Scale: Fair     Standing balance support: No upper extremity supported;During functional activity Standing balance-Leahy Scale: Fair                             ADL either performed or assessed with clinical judgement   ADL Overall ADL's : Needs assistance/impaired     Grooming: Min guard;Standing   Upper Body Bathing: Set up;Sitting   Lower Body Bathing: Min guard;Sit to/from stand   Upper Body Dressing : Set up;Sitting   Lower Body Dressing: Min guard;Sit to/from stand Lower Body Dressing Details (indicate cue type and reason): reviewed figure 4 technique and safety due to lightheadedness with bending forward  Toilet Transfer: Min guard;Ambulation   Toileting- Clothing Manipulation and Hygiene: Min guard;Sit to/from stand       Functional mobility during ADLs: Min guard General ADL Comments: pt requires cueing for safety with mobility (pacing with positional changes), 1 LOB turning at sink; reviewed safety recommendations and having GF assist with all mobilty at this time      Vision Baseline Vision/History: No visual deficits Patient Visual Report: No change from baseline Vision Assessment?: Yes Eye Alignment: Within Functional Limits Ocular Range of Motion: Within Functional Limits Alignment/Gaze Preference: Within Defined Limits Tracking/Visual Pursuits: Able to track stimulus in all quads without difficulty Visual Fields: No apparent deficits Additional Comments: vision appears [facility]      Perception     Praxis  Pertinent Vitals/Pain Pain Assessment: Faces Faces Pain Scale: Hurts a little bit Pain Location: headache Pain Descriptors / Indicators: Headache Pain Intervention(s): Limited activity within patient's tolerance;Monitored during  session;Repositioned     Hand Dominance Right   Extremity/Trunk Assessment Upper Extremity Assessment Upper Extremity Assessment: Overall WFL for tasks assessed   Lower Extremity Assessment Lower Extremity Assessment: Defer to PT evaluation   Cervical / Trunk Assessment Cervical / Trunk Assessment: Normal   Communication Communication Communication: No difficulties   Cognition Arousal/Alertness: Awake/alert Behavior During Therapy: Flat affect Overall Cognitive Status: Impaired/Different from baseline Area of Impairment: Orientation;Memory;Safety/judgement;Awareness;Problem solving                 Orientation Level: Disoriented to;Situation   Memory: Decreased short-term memory   Safety/Judgement: Decreased awareness of deficits Awareness: Emergent Problem Solving: Requires verbal cues;Slow processing General Comments: pt very flat, slow to respond and requires increased time for processing; decreased STM requires cueing to recall CVA (but remembers fall)    General Comments  reviewed pacing and safety; discussed with RN to have GF assist with meds at dc due to cognitive deficits seen    Exercises     Shoulder Instructions      Home Living Family/patient expects to be discharged to:: Private residence Living Arrangements: Spouse/significant other Available Help at Discharge: Family;Available 24 hours/day Type of Home: House Home Access: Level entry     Home Layout: Two level Alternate Level Stairs-Number of Steps: 12 Alternate Level Stairs-Rails: Right Bathroom Shower/Tub: Tub only;Walk-in shower   Bathroom Toilet: Standard     Home Equipment: None          Prior Functioning/Environment Level of Independence: Independent        Comments: Still working        OT Problem List: Decreased strength;Decreased activity tolerance;Impaired balance (sitting and/or standing);Pain;Decreased knowledge of precautions;Decreased knowledge of use of DME or  AE;Decreased safety awareness;Decreased cognition      OT Treatment/Interventions: Self-care/ADL training;DME and/or AE instruction;Therapeutic activities;Patient/family education;Balance training;Cognitive remediation/compensation    OT Goals(Current goals can be found in the care plan section) Acute Rehab OT Goals Patient Stated Goal: to feel better OT Goal Formulation: With patient Time For Goal Achievement: 09/26/20 Potential to Achieve Goals: Good  OT Frequency: Min 2X/week   Barriers to D/C:            Co-evaluation              AM-PAC OT "6 Clicks" Daily Activity     Outcome Measure Help from another person eating meals?: None Help from another person taking care of personal grooming?: A Little Help from another person toileting, which includes using toliet, bedpan, or urinal?: A Little Help from another person bathing (including washing, rinsing, drying)?: A Little Help from another person to put on and taking off regular upper body clothing?: A Little Help from another person to put on and taking off regular lower body clothing?: A Little 6 Click Score: 19   End of Session Equipment Utilized During Treatment: Gait belt Nurse Communication: Mobility status  Activity Tolerance: Patient tolerated treatment well Patient left: in bed;with call bell/phone within reach;with bed alarm set  OT Visit Diagnosis: Other abnormalities of gait and mobility (R26.89);Pain;History of falling (Z91.81);Other symptoms and signs involving cognitive function Pain - part of body:  (headache)                Time: 2952-8413 OT Time Calculation (min): 25 min Charges:  OT General Charges $OT  Visit: 1 Visit OT Evaluation $OT Eval Moderate Complexity: 1 Mod  Barry Brunner, OT Acute Rehabilitation Services Pager [phone] Office [phone]   Chancy Milroy 09/12/2020, 9:48 AM

## 2020-09-12 NOTE — TOC CAGE-AID Note (Signed)
Transition of Care [facility]) - CAGE-AID Screening   Patient Details  Name: Logan Dixon MRN: [id number] Date of Birth: 1971-10-12  Transition of Care [facility]) CM/SW Contact:    Jimmy Picket, LCSWA Phone Number: 09/12/2020, 3:17 PM   Clinical Narrative:  Pt did not want to participate in assessment. Pt stated he was ready to discharge.  CAGE-AID Screening: Substance Abuse Screening unable to be completed due to: : Patient Refused               Jimmy Picket, Bryon Lions Clinical Social Worker [phone]

## 2020-09-12 NOTE — Discharge Instructions (Signed)
Accidental Drug Poisoning, Adult Accidental drug poisoning happens when a person accidentally takes too much of a substance, such as a prescription medicine, an over-the-counter medicine, a vitamin, a supplement, or an illegal drug. The effects of drug poisoning can be mild, dangerous, or even deadly. What are the causes? This condition is caused by taking too much of a medicine, illegal drug, or other substance. It often results from:  Lack of knowledge about a substance.  Using more than one substance at the same time.  An error made by the health care provider who prescribed the substance.  An error made by the pharmacist who filled the prescription.  A lapse in memory, such as forgetting that you have already taken a dose of the medicine.  Suddenly using a substance after a long period of not using it. The following substances and medicines are more likely to cause an accidental drug poisoning:  Medicines that treat mental problems (psychotropic medicines).  Pain medicines.  Cocaine.  Heroin.  Multivitamins that contain iron.  Over-the-counter cold and cough medicines. What increases the risk? This condition is more likely to occur in:  Elderly adults. Elderly adults are at risk because they may: ? Be taking many different medicines. ? Have difficulty reading labels. ? Forget when they last took their medicine.  People who use illegal drugs.  People who drink alcohol while using illegal drugs or certain medicines.  People with certain mental health conditions. What are the signs or symptoms? Symptoms of this condition depend on the substance and the amount that was taken. Common symptoms include:  Behavior changes, such as confusion.  Sleepiness.  Weakness.  Slowed breathing.  Nausea and vomiting.  Seizures.  Very large or small eye pupil size. A drug poisoning can cause a very serious condition in which your blood pressure drops to a low level  (shock). Symptoms of shock include:  Cold and clammy skin.  Pale skin.  Blue lips.  Very slow breathing.  Extreme sleepiness.  Severe confusion.  Dizziness or fainting. How is this diagnosed? This condition is diagnosed based on:  Your symptoms. You will be asked about the substances you took and when you took them.  A physical exam. You may also have other tests, including:  Urine tests.  Blood tests.  An electrocardiogram (ECG). How is this treated? This condition may need to be treated right away at the hospital. Treatment may involve:  Getting fluids and electrolytes through an IV.  Having a breathing tube inserted in your airway (endotracheal tube) to help you breathe.  Taking medicines. These may include medicines that: ? Absorb any substance that is in your digestive system. ? Block or reverse the effect of the substance that caused the drug poisoning.  Having your blood filtered through an artificial kidney machine (hemodialysis).  Ongoing counseling and mental health support. This may be provided if you used an illegal drug. Follow these instructions at home: Medicines   Take over-the-counter and prescription medicines only as told by your health care provider.  Before taking a new medicine, ask your health care provider whether the medicine: ? May cause side effects. ? Might react with other medicines.  Keep a list of all the medicines that you take, including over-the-counter medicines, vitamins, supplements, and herbs. Bring this list with you to all of your medical visits. General instructions   Drink enough fluid to keep your urine pale yellow.  If you are working with a Veterinary surgeoncounselor or Financial tradermental health professional,  make sure to follow his or her instructions.  Do not drink alcohol if: ? Your health care provider tells you not to drink. ? You are pregnant, may be pregnant, or are planning to become pregnant.  If you drink alcohol, limit how  much you have: ? 0-1 drink a day for women. ? 0-2 drinks a day for men.  Be aware of how much alcohol is in your drink. In the [place] one drink equals one typical bottle of beer (12 oz), one-half glass of wine (5 oz), or one shot of hard liquor (1 oz).  Keep all follow-up visits as told by your health care provider. This is important. How is this prevented?   Get help if you are struggling with: ? Alcohol or drug use. ? Depression or another mental health problem.  Keep the phone number of your local poison control center near your phone or on your cell phone. The hotline of the [employer] is (8002537982067.  Store all medicines in safety containers that are out of the reach of children.  Read the drug inserts that come with your medicines.  Create a system for taking your medicine, such as a pillbox, that will help you avoid taking too much of the medicine.  Do not drink alcohol while taking medicines unless your health care provider approves.  Do not use illegal drugs.  Do not take medicines that are not prescribed for you. Contact a health care provider if:  Your symptoms return.  You develop new symptoms or side effects after taking a medicine.  You have questions about possible drug poisoning. Call your local poison control center at 4314545825. Get help right away if:  You think that you or someone else may have taken too much of a substance.  You or someone else is having symptoms of drug poisoning. Summary  Accidental drug poisoning happens when a person accidentally takes too much of a substance, such as a prescription medicine, an over-the-counter medicine, a vitamin, a supplement, or an illegal drug.  The effects of drug poisoning can be mild, dangerous, or even deadly.  This condition is diagnosed based on your symptoms and a physical exam. You will be asked to tell your health care provider which substances you took and  when you took them.  This condition may need to be treated right away at the hospital. This information is not intended to replace advice given to you by your health care provider. Make sure you discuss any questions you have with your health care provider. Document Revised: 10/10/2017 Document Reviewed: 09/29/2017 Elsevier Patient Education  2020 [employer]  Vertigo Vertigo is the feeling that you or the things around you are moving when they are not. This feeling can come and go at any time. Vertigo often goes away on its own. This condition can be dangerous if it happens when you are doing activities like driving or working with machines. Your doctor will do tests to find the cause of your vertigo. These tests will also help your doctor decide on the best treatment for you. Follow these instructions at home: Eating and drinking      Drink enough fluid to keep your pee (urine) pale yellow.  Do not drink alcohol. Activity  Return to your normal activities as told by your doctor. Ask your doctor what activities are safe for you.  In the morning, first sit up on the side of the bed. When you feel  okay, stand slowly while you hold onto something until you know that your balance is fine.  Move slowly. Avoid sudden body or head movements or certain positions, as told by your doctor.  Use a cane if you have trouble standing or walking.  Sit down right away if you feel dizzy.  Avoid doing any tasks or activities that can cause danger to you or others if you get dizzy.  Avoid bending down if you feel dizzy. Place items in your home so that they are easy for you to reach without leaning over.  Do not drive or use heavy machinery if you feel dizzy. General instructions  Take over-the-counter and prescription medicines only as told by your doctor.  Keep all follow-up visits as told by your doctor. This is important. Contact a doctor if:  Your medicine does not help your  vertigo.  You have a fever.  Your problems get worse or you have new symptoms.  Your family or friends see changes in your behavior.  The feeling of being sick to your stomach gets worse.  Your vomiting gets worse.  You lose feeling (have numbness) in part of your body.  You feel prickling and tingling in a part of your body. Get help right away if:  You have trouble moving or talking.  You are always dizzy.  You pass out (faint).  You get very bad headaches.  You feel weak in your hands, arms, or legs.  You have changes in your hearing.  You have changes in how you see (vision).  You get a stiff neck.  Bright light starts to bother you. Summary  Vertigo is the feeling that you or the things around you are moving when they are not.  Your doctor will do tests to find the cause of your vertigo.  You may be told to avoid some tasks, positions, or movements.  Contact a doctor if your medicine is not helping, or if you have a fever, new symptoms, or a change in behavior.  Get help right away if you get very bad headaches, or if you have changes in how you speak, hear, or see. This information is not intended to replace advice given to you by your health care provider. Make sure you discuss any questions you have with your health care provider. Document Revised: 09/21/2018 Document Reviewed: 09/21/2018 Elsevier Patient Education  2020 [employer].  Ischemic Stroke  An ischemic stroke (cerebrovascular accident, or CVA) is the sudden death of brain tissue that occurs when an area of the brain does not get enough oxygen. It is a medical emergency that must be treated right away. An ischemic stroke can cause permanent loss of brain function. This can cause problems with how different parts of your body function. What are the causes? This condition is caused by a decrease of oxygen supply to an area of the brain, which may be the result of:  A small blood clot (embolus)  or a buildup of plaque in the blood vessels (atherosclerosis) that blocks blood flow in the brain.  An abnormal heart rhythm (atrial fibrillation).  A blocked or damaged artery in the head or neck. Sometimes the cause of stroke is not known (cryptogenic). What increases the risk? Certain factors may make you more likely to develop this condition. Some of these factors are things that you can change, such as:  Obesity.  Smoking cigarettes.  Taking oral birth control, especially if you also use tobacco.  Physical inactivity.  Excessive alcohol use.  Use of illegal drugs, especially cocaine and methamphetamine. Other risk factors include:  High blood pressure (hypertension).  High cholesterol.  Diabetes mellitus.  Heart disease.  Being [place] American, Native [address], Hispanic, or [place] Native.  Being over age 77.  Family history of stroke.  Previous history of blood clots, stroke, or transient ischemic attack (TIA).  Sickle cell disease.  Being a woman with a history of preeclampsia.  Migraine headache.  Sleep apnea.  Irregular heartbeats, such as atrial fibrillation.  Chronic inflammatory diseases, such as rheumatoid arthritis or lupus.  Blood clotting disorders (hypercoagulable state). What are the signs or symptoms? Symptoms of this condition usually develop suddenly, or you may notice them after waking up from sleep. Symptoms may include sudden:  Weakness or numbness in your face, arm, or leg, especially on one side of your body.  Trouble walking or difficulty moving your arms or legs.  Loss of balance or coordination.  Confusion.  Slurred speech (dysarthria).  Trouble speaking, understanding speech, or both (aphasia).  Vision changes--such as double vision, blurred vision, or loss of vision--in one or both eyes.  Dizziness.  Nausea and vomiting.  Severe headache with no known cause. The headache is often described as the worst headache  ever experienced. If possible, make note of the exact time that you last felt like your normal self and what time your symptoms started. Tell your health care provider. If symptoms come and go, this could be a sign of a warning stroke, or TIA. Get help right away, even if you feel better. How is this diagnosed? This condition may be diagnosed based on:  Your symptoms, your medical history, and a physical exam.  CT scan of the brain.  MRI.  CT angiogram. This test uses a computer to take X-rays of your arteries. A dye may be injected into your blood to show the inside of your blood vessels more clearly.  MRI angiogram. This is a type of MRI that is used to evaluate the blood vessels.  Cerebral angiogram. This test uses X-rays and a dye to show the blood vessels in the brain and neck. You may need to see a health care provider who specializes in stroke care. A stroke specialist can be seen in person or through communication using telephone or television technology (telemedicine). Other tests may also be done to find the cause of the stroke, such as:  Electrocardiogram (ECG).  Continuous heart monitoring.  Echocardiogram.  Transesophageal echocardiogram (TEE).  Carotid ultrasound.  A scan of the brain circulation.  Blood tests.  Sleep study to check for sleep apnea. How is this treated? Treatment for this condition will depend on the duration, severity, and cause of your symptoms and on the area of the brain affected. It is very important to get treatment at the first sign of stroke symptoms. Some treatments work better if they are done within 3-6 hours of the onset of stroke symptoms. These initial treatments may include:  Aspirin.  Medicines to control blood pressure.  Medicine given by injection to dissolve the blood clot (thrombolytic).  Treatments given directly to the affected artery to remove or dissolve the blood clot. Other treatment options may  include:  Oxygen.  IV fluids.  Medicines to thin the blood (anticoagulants or antiplatelets).  Procedures to increase blood flow. Medicines and changes to your diet may be used to help treat and manage risk factors for stroke, such as diabetes, high cholesterol, and high blood pressure. After  a stroke, you may work with physical, speech, mental health, or occupational therapists to help you recover. Follow these instructions at home: Medicines  Take over-the-counter and prescription medicines only as told by your health care provider.  If you were told to take a medicine to thin your blood, such as aspirin or an anticoagulant, take it exactly as told by your health care provider. ? Taking too much blood-thinning medicine can cause bleeding. ? If you do not take enough blood-thinning medicine, you will not have the protection that you need against another stroke and other problems.  Understand the side effects of taking anticoagulant medicine. When taking this type of medicine, make sure you: ? Hold pressure over any cuts for longer than usual. ? Tell your dentist and other health care providers that you are taking anticoagulants before you have any procedures that may cause bleeding. ? Avoid activities that may cause trauma or injury. Eating and drinking  Follow instructions from your health care provider about diet.  Eat healthy foods.  If your ability to swallow was affected by the stroke, you may need to take steps to avoid choking, such as: ? Taking small bites when eating. ? Eating foods that are soft or pureed. Safety  Follow instructions from your health care team about physical activity.  Use a walker or cane as told by your health care provider.  Take steps to create a safe home environment in order to reduce the risk of falls. This may include: ? Having your home looked at by specialists. ? Installing grab bars in the bedroom and bathroom. ? Using safety equipment,  such as raised toilets and a seat in the shower. General instructions  Do not use any tobacco products, such as cigarettes, chewing tobacco, and e-cigarettes. If you need help quitting, ask your health care provider.  Limit alcohol intake to no more than 1 drink a day for nonpregnant women and 2 drinks a day for men. One drink equals 12 oz of beer, 5 oz of wine, or 1 oz of hard liquor.  If you need help to stop using drugs or alcohol, ask your health care provider about a referral to a program or specialist.  Maintain an active and healthy lifestyle. Get regular exercise as told by your health care provider.  Keep all follow-up visits as told by your health care provider, including visits with all specialists on your health care team. This is important. How is this prevented? Your risk of another stroke can be decreased by managing high blood pressure, high cholesterol, diabetes, heart disease, sleep apnea, and obesity. It can also be decreased by quitting smoking, limiting alcohol, and staying physically active. Your health care provider will continue to work with you on measures to prevent short-term and long-term complications of stroke. Get help right away if:   You have any symptoms of a stroke. "BE FAST" is an easy way to remember the main warning signs of a stroke: ? B - Balance. Signs are dizziness, sudden trouble walking, or loss of balance. ? E - Eyes. Signs are trouble seeing or a sudden change in vision. ? F - Face. Signs are sudden weakness or numbness of the face, or the face or eyelid drooping on one side. ? A - Arms. Signs are weakness or numbness in an arm. This happens suddenly and usually on one side of the body. ? S - Speech. Signs are sudden trouble speaking, slurred speech, or trouble understanding what people say. ?  T - Time. Time to call emergency services. Write down what time symptoms started.  You have other signs of a stroke, such as: ? A sudden, severe  headache with no known cause. ? Nausea or vomiting. ? Seizure.  These symptoms may represent a serious problem that is an emergency. Do not wait to see if the symptoms will go away. Get medical help right away. Call your local emergency services (911 in the [place] Do not drive yourself to the hospital. Summary  An ischemic stroke (cerebrovascular accident, or CVA) is the sudden death of brain tissue that occurs when an area of the brain does not get enough oxygen.  Symptoms of this condition usually develop suddenly, or you may notice them after waking up from sleep.  It is very important to get treatment at the first sign of stroke symptoms. Stroke is a medical emergency that must be treated right away. This information is not intended to replace advice given to you by your health care provider. Make sure you discuss any questions you have with your health care provider. Document Revised: 07/17/2018 Document Reviewed: 01/24/2016 Elsevier Patient Education  2020 [employer] Inc. Apply antibiotic ointment to the scalp laceration.  Take over-the-counter medications.  Avoid drug use in the future

## 2020-09-14 ENCOUNTER — Encounter (HOSPITAL_COMMUNITY): Payer: Self-pay | Admitting: Emergency Medicine

## 2020-09-14 ENCOUNTER — Ambulatory Visit (HOSPITAL_COMMUNITY)
Admission: EM | Admit: 2020-09-14 | Discharge: 2020-09-14 | Disposition: A | Discharge status: Home / Self Care | Payer: Commercial Managed Care - PPO | Attending: Family Medicine | Admitting: Family Medicine

## 2020-09-14 ENCOUNTER — Emergency Department (HOSPITAL_COMMUNITY)
Admission: EM | Admit: 2020-09-14 | Discharge: 2020-09-15 | Disposition: A | Discharge status: Home / Self Care | Payer: Commercial Managed Care - PPO | Attending: Emergency Medicine | Admitting: Emergency Medicine

## 2020-09-14 ENCOUNTER — Encounter (HOSPITAL_COMMUNITY): Payer: Self-pay

## 2020-09-14 ENCOUNTER — Other Ambulatory Visit: Payer: Self-pay

## 2020-09-14 ENCOUNTER — Emergency Department (HOSPITAL_COMMUNITY): Payer: Commercial Managed Care - PPO

## 2020-09-14 DIAGNOSIS — R110 Nausea: Secondary | ICD-10-CM | POA: Diagnosis not present

## 2020-09-14 DIAGNOSIS — Z7982 Long term (current) use of aspirin: Secondary | ICD-10-CM | POA: Insufficient documentation

## 2020-09-14 DIAGNOSIS — R42 Dizziness and giddiness: Secondary | ICD-10-CM

## 2020-09-14 DIAGNOSIS — R519 Headache, unspecified: Secondary | ICD-10-CM

## 2020-09-14 DIAGNOSIS — R058 Other specified cough: Secondary | ICD-10-CM

## 2020-09-14 DIAGNOSIS — Z20822 Contact with and (suspected) exposure to covid-19: Secondary | ICD-10-CM | POA: Diagnosis not present

## 2020-09-14 DIAGNOSIS — Z23 Encounter for immunization: Secondary | ICD-10-CM | POA: Diagnosis not present

## 2020-09-14 DIAGNOSIS — F17210 Nicotine dependence, cigarettes, uncomplicated: Secondary | ICD-10-CM | POA: Diagnosis not present

## 2020-09-14 DIAGNOSIS — R059 Cough, unspecified: Secondary | ICD-10-CM | POA: Insufficient documentation

## 2020-09-14 DIAGNOSIS — F0781 Postconcussional syndrome: Secondary | ICD-10-CM

## 2020-09-14 HISTORY — DX: Poisoning by unspecified drugs, medicaments and biological substances, accidental (unintentional), initial encounter: T50.901A

## 2020-09-14 HISTORY — DX: Acute respiratory failure, unspecified whether with hypoxia or hypercapnia: J96.00

## 2020-09-14 HISTORY — DX: Cerebral infarction, unspecified: I63.9

## 2020-09-14 LAB — BASIC METABOLIC PANEL
Anion gap: 10 (ref 5–15)
BUN: 10 mg/dL (ref 6–20)
CO2: 27 mmol/L (ref 22–32)
Calcium: 9.3 mg/dL (ref 8.9–10.3)
Chloride: 99 mmol/L (ref 98–111)
Creatinine, Ser: 1.31 mg/dL — ABNORMAL HIGH (ref 0.61–1.24)
GFR, Estimated: >60 mL/min (ref >60)
Glucose, Bld: 104 mg/dL — ABNORMAL HIGH (ref 70–99)
Potassium: 3.7 mmol/L (ref 3.5–5.1)
Sodium: 136 mmol/L (ref 135–145)

## 2020-09-14 LAB — CBC
HCT: 46.3 % (ref 39.0–52.0)
Hemoglobin: 15.7 g/dL (ref 13.0–17.0)
MCH: 29.5 pg (ref 26.0–34.0)
MCHC: 33.9 g/dL (ref 30.0–36.0)
MCV: 87 fL (ref 80.0–100.0)
Platelets: 370 10*3/uL (ref 150–400)
RBC: 5.32 MIL/uL (ref 4.22–5.81)
RDW: 12.6 % (ref 11.5–15.5)
WBC: 10.2 10*3/uL (ref 4.0–10.5)
nRBC: 0 % (ref 0.0–0.2)

## 2020-09-14 MED ORDER — TETANUS-DIPHTH-ACELL PERTUSSIS 5-2.5-18.5 LF-MCG/0.5 IM SUSY
0.5000 mL | PREFILLED_SYRINGE | Freq: Once | INTRAMUSCULAR | Status: AC
  Administered 2020-09-14: 0.5 mL via INTRAMUSCULAR
  Filled 2020-09-14: qty 0.5

## 2020-09-14 MED ORDER — ACETAMINOPHEN 500 MG PO TABS
1000.0000 mg | ORAL_TABLET | Freq: Once | ORAL | Status: AC
  Administered 2020-09-14: 1000 mg via ORAL
  Filled 2020-09-14: qty 2

## 2020-09-14 MED ORDER — ONDANSETRON 4 MG PO TBDP
8.0000 mg | ORAL_TABLET | Freq: Once | ORAL | Status: AC
Start: 2020-09-14 — End: 2020-09-14
  Administered 2020-09-14: 8 mg via ORAL
  Filled 2020-09-14: qty 2

## 2020-09-14 NOTE — ED Notes (Signed)
Tolerated fluids well. No nausea and vomiting at this time.

## 2020-09-14 NOTE — Discharge Instructions (Addendum)
It was our pleasure to provide your ER care today - we hope that you feel better.  Your covid/flu test is pending, and the results should be back in 1-2 hours - you can call back for results, or check My Chart.   Rest. Drink plenty of fluids.   Take zofran as need for nausea.   Take acetaminophen as need.   Follow up with primary care doctor in 1 week.  Return to ER if worse, new symptoms, new or severe pain, persistent vomiting, fevers, infection of wound, increased trouble breathing, or other concern.

## 2020-09-14 NOTE — ED Provider Notes (Addendum)
[facility] EMERGENCY DEPARTMENT Provider Note   CSN: [id number] Arrival date & time: 09/14/20  1835     History Chief Complaint  Patient presents with  . Headache  . Dizziness    Logan Dixon is a 49 y.o. male.  Patient s/p recent head injury, was told 'small stroke' then on MRI, and indicates since then, intermittent dull headaches, nausea, and also notes non prod cough. Symptoms acute onset post fall, and indicates present since recent hospital admission. No acute or abrupt change today, rather persistence of symptoms. Headache dull, diffuse, moderate. No eye pain or change in vision. No neck pain or stiffness. No numbness/weakness. No problems w balance/coordiation. No problems w speech. Denies fever or chills. No sore throat. +non prod cough. No known covid exposure. No cp or sob.   The history is provided by the patient.  Headache Associated symptoms: cough and nausea   Associated symptoms: no abdominal pain, no back pain, no dizziness, no fever, no neck pain, no numbness, no sore throat and no vomiting   Dizziness Associated symptoms: headaches and nausea   Associated symptoms: no chest pain, no shortness of breath and no vomiting        Past Medical History:  Diagnosis Date  . 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 Acute respiratory failure (HCC)   . Ischemic stroke (HCC)     There are no problems to display for this patient.   History reviewed. No pertinent surgical history.     Family History  Family history unknown: Yes    Social History   Tobacco Use  . Smoking status: Current Every Day Smoker    Packs/day: 0.50    Types: Cigarettes  . Smokeless tobacco: Never Used  Substance Use Topics  . Alcohol use: Yes    Alcohol/week: 21.0 standard drinks    Types: 21 Cans of beer per week  . Drug use: Yes    Types: Marijuana    Home Medications Prior to Admission medications   Medication Sig Start Date End Date Taking? Authorizing  Provider  acetaminophen (TYLENOL) 500 MG tablet Take 1,000 mg by mouth every 6 (six) hours as needed for headache.   Yes [provider]  aspirin 81 MG chewable tablet Chew 81 mg by mouth daily.    Yes [provider]  atorvastatin (LIPITOR) 40 MG tablet Take 40 mg by mouth daily.   Yes [provider]  benzonatate (TESSALON) 100 MG capsule Take 1 capsule (100 mg total) by mouth every 8 (eight) hours. Patient not taking: Reported on 09/14/2020 06/02/20   McDonald, Mia A, PA-C  ondansetron (ZOFRAN ODT) 4 MG disintegrating tablet Take 1 tablet (4 mg total) by mouth every 8 (eight) hours as needed. Patient not taking: Reported on 09/14/2020 06/02/20   Frederik Pear A, PA-C    Allergies    Patient has no known allergies.  Review of Systems   Review of Systems  Constitutional: Negative for chills and fever.  HENT: Negative for sore throat.   Eyes: Negative for redness.  Respiratory: Positive for cough. Negative for shortness of breath.   Cardiovascular: Negative for chest pain.  Gastrointestinal: Positive for nausea. Negative for abdominal pain and vomiting.  Genitourinary: Negative for flank pain.  Musculoskeletal: Negative for back pain and neck pain.  Skin: Negative for rash.  Neurological: Positive for headaches. Negative for dizziness, speech difficulty and numbness.  Hematological: Does not bruise/bleed easily.  Psychiatric/Behavioral: Negative for confusion.    Physical Exam  Updated Vital Signs BP 128/80 (BP Location: Right Arm)   Pulse 81   Temp 98.7 F (37.1 C) (Oral)   Resp 18   Ht 1.778 m (5\' 10" )   Wt 90.7 kg   SpO2 99%   BMI 28.70 kg/m   Physical Exam Vitals and nursing note reviewed.  Constitutional:      Appearance: Normal appearance. He is well-developed.  HENT:     Head: Atraumatic.     Comments: Healing wound to superior occipital scalp, no sign of infection.     Right Ear: Tympanic membrane normal.     Left Ear: Tympanic membrane  normal.     Nose: Nose normal.     Mouth/Throat:     Mouth: Mucous membranes are moist.     Pharynx: Oropharynx is clear.  Eyes:     General: No scleral icterus.    Extraocular Movements: Extraocular movements intact.     Conjunctiva/sclera: Conjunctivae normal.     Pupils: Pupils are equal, round, and reactive to light.  Neck:     Vascular: No carotid bruit.     Trachea: No tracheal deviation.     Comments: Normal rom. No stiffness or rigidity.  Cardiovascular:     Rate and Rhythm: Normal rate and regular rhythm.     Pulses: Normal pulses.     Heart sounds: Normal heart sounds. No murmur heard.  No friction rub. No gallop.   Pulmonary:     Effort: Pulmonary effort is normal. No accessory muscle usage or respiratory distress.     Breath sounds: Normal breath sounds.  Abdominal:     General: Bowel sounds are normal. There is no distension.     Palpations: Abdomen is soft.     Tenderness: There is no abdominal tenderness.  Genitourinary:    Comments: No cva tenderness. Musculoskeletal:        General: No swelling or tenderness.     Cervical back: Normal range of motion and neck supple. No rigidity or tenderness.     Right lower leg: No edema.     Left lower leg: No edema.     Comments: CTLS spine, non tender, aligned, no step off.   Skin:    General: Skin is warm and dry.     Findings: No rash.  Neurological:     Mental Status: He is alert.     Comments: Alert, speech clear. GCS 15. Motor/sens grossly intact bil. Steady gait.   Psychiatric:        Mood and Affect: Mood normal.     ED Results / Procedures / Treatments   Labs (all labs ordered are listed, but only abnormal results are displayed) Results for orders placed or performed during the hospital encounter of 09/14/20  Basic metabolic panel  Result Value Ref Range   Sodium 136 135 - 145 mmol/L   Potassium 3.7 3.5 - 5.1 mmol/L   Chloride 99 98 - 111 mmol/L   CO2 27 22 - 32 mmol/L   Glucose, Bld 104 (H) 70 -  99 mg/dL   BUN 10 6 - 20 mg/dL   Creatinine, Ser 13/04/21 (H) 0.61 - 1.24 mg/dL   Calcium 9.3 8.9 - 1.61 mg/dL   GFR, Estimated 09.6 >04 mL/min   Anion gap 10 5 - 15  CBC  Result Value Ref Range   WBC 10.2 4.0 - 10.5 K/uL   RBC 5.32 4.22 - 5.81 MIL/uL   Hemoglobin 15.7 13.0 - 17.0 g/dL   HCT  46.3 39 - 52 %   MCV 87.0 80.0 - 100.0 fL   MCH 29.5 26.0 - 34.0 pg   MCHC 33.9 30.0 - 36.0 g/dL   RDW 01.5 61.5 - 37.9 %   Platelets 370 150 - 400 K/uL   nRBC 0.0 0.0 - 0.2 %    EKG EKG Interpretation  Date/Time:  Thursday September 14 2020 18:46:15 EDT Ventricular Rate:  101 PR Interval:  146 QRS Duration: 98 QT Interval:  346 QTC Calculation: 448 R Axis:   61 Text Interpretation: Sinus tachycardia Non-specific ST-t changes `similar to prior ecg Confirmed by Cathren Laine ([id number]) on 09/14/2020 10:28:24 PM   Radiology No results found.  Procedures Procedures (including critical care time)  Medications Ordered in ED Medications - No data to display  ED Course  I have reviewed the triage vital signs and the nursing notes.  Pertinent labs & imaging results that were available during my care of the patient were reviewed by me and considered in my medical decision making (see chart for details).    MDM Rules/Calculators/A&P                          Lab sent from triage. Ct.   Reviewed nursing notes and prior charts for additional history. Recent ct and mri reviewed.   Labs reviewed/interpreted by me - wbc and hgb normal. Chem normal. zofran po. Po fluids.  CT reviewed/interpreted by me - no hem.  Patient tolerating po fluids.   Pt unsure when last tetanus shot. Tetanus im.   2317 CT pending - signed out to Dr Clayborne Dana to f/u on CT scan (if CT negative, anticipate probable d/c to home then).     Final Clinical Impression(s) / ED Diagnoses Final diagnoses:  None    Rx / DC Orders ED Discharge Orders    None           Cathren Laine, MD 09/14/20 2318

## 2020-09-14 NOTE — Discharge Instructions (Signed)
Patient to emergency room

## 2020-09-14 NOTE — ED Notes (Signed)
Patient is being discharged from the Urgent Care and sent to the Emergency Department via POV . Per Patterson Hammersmith, NP, patient is in need of higher level of care due to worsening headache with vomiting after recent stroke. Patient is aware and verbalizes understanding of plan of care.  Vitals:   09/14/20 1758  BP: 109/69  Pulse: 90  Resp: 17  Temp: 98.6 F (37 C)  SpO2: 100%

## 2020-09-14 NOTE — ED Provider Notes (Signed)
[facility]    CSN: [id number] Arrival date & time: 09/14/20  1726      History   Chief Complaint Chief Complaint  Patient presents with   Dizziness   Weakness   Vomiting   Cough    HPI Logan Dixon is a 49 y.o. male history of recent ischemic stroke presenting today for evaluation of headache dizziness vomiting and cough.  Patient recently was noted to have ischemic occipital infarct on 10/31 after episode of alcohol, cocaine and fentanyl use ending in head injury.  Patient presenting today as since his discharge she has developed worsening headache in occipital area with vomiting dizziness as well as he reports increased cough.  HPI  Past Medical History:  Diagnosis Date   Accidental poisoning by drug    Acute respiratory failure (HCC)    Ischemic stroke (HCC)     There are no problems to display for this patient.   History reviewed. No pertinent surgical history.     Home Medications    Prior to Admission medications   Medication Sig Start Date End Date Taking? Authorizing Provider  aspirin 81 MG chewable tablet Chew by mouth daily.   Yes [provider]  atorvastatin (LIPITOR) 40 MG tablet Take 40 mg by mouth daily.   Yes [provider]  benzonatate (TESSALON) 100 MG capsule Take 1 capsule (100 mg total) by mouth every 8 (eight) hours. 06/02/20   McDonald, Mia A, PA-C  ondansetron (ZOFRAN ODT) 4 MG disintegrating tablet Take 1 tablet (4 mg total) by mouth every 8 (eight) hours as needed. 06/02/20   McDonald, Coral Else, PA-C    Family History Family History  Family history unknown: Yes    Social History Social History   Tobacco Use   Smoking status: Current Every Day Smoker    Packs/day: 0.50    Types: Cigarettes   Smokeless tobacco: Never Used  Substance Use Topics   Alcohol use: Yes    Alcohol/week: 21.0 standard drinks    Types: 21 Cans of beer per week   Drug use: Yes    Types: Marijuana      Allergies   Patient has no known allergies.   Review of Systems Review of Systems  Constitutional: Negative for fatigue and fever.  HENT: Negative for congestion, sinus pressure and sore throat.   Eyes: Positive for photophobia. Negative for pain and visual disturbance.  Respiratory: Positive for cough. Negative for shortness of breath.   Cardiovascular: Negative for chest pain.  Gastrointestinal: Positive for vomiting. Negative for abdominal pain and nausea.  Genitourinary: Negative for decreased urine volume and hematuria.  Musculoskeletal: Negative for myalgias, neck pain and neck stiffness.  Neurological: Positive for dizziness, weakness and headaches. Negative for syncope, facial asymmetry, speech difficulty, light-headedness and numbness.     Physical Exam Triage Vital Signs ED Triage Vitals  Enc Vitals Group     BP 09/14/20 1758 109/69     Pulse Rate 09/14/20 1758 90     Resp 09/14/20 1758 17     Temp 09/14/20 1758 98.6 F (37 C)     Temp Source 09/14/20 1758 Oral     SpO2 09/14/20 1758 100 %     Weight --      Height --      Head Circumference --      Peak Flow --      Pain Score 09/14/20 1751 7     Pain Loc --      Pain  Edu? --      Excl. in GC? --    No data found.  Updated Vital Signs BP 109/69 (BP Location: Right Arm)    Pulse 90    Temp 98.6 F (37 C) (Oral)    Resp 17    SpO2 100%   Visual Acuity Right Eye Distance:   Left Eye Distance:   Bilateral Distance:    Right Eye Near:   Left Eye Near:    Bilateral Near:     Physical Exam Vitals and nursing note reviewed.  Constitutional:      Appearance: He is well-developed.     Comments: No acute distress  HENT:     Head: Normocephalic and atraumatic.     Comments: Abrasion noted to crown of head with associated swelling    Ears:     Comments: Bilateral ears without tenderness to palpation of external auricle, tragus and mastoid, EAC's without erythema or swelling, TM's with good bony  landmarks and cone of light. Non erythematous.     Nose: Nose normal.     Mouth/Throat:     Comments: Oral mucosa pink and moist, no tonsillar enlargement or exudate. Posterior pharynx patent and nonerythematous, no uvula deviation or swelling. Normal phonation. Eyes:     Extraocular Movements: Extraocular movements intact.     Conjunctiva/sclera: Conjunctivae normal.     Pupils: Pupils are equal, round, and reactive to light.     Comments: Mild photophobia, slight difficulty consistently following finger with extraocular motions, but with redirection is able to move in all directions  Cardiovascular:     Rate and Rhythm: Normal rate.  Pulmonary:     Effort: Pulmonary effort is normal. No respiratory distress.     Comments: Breathing comfortably at rest, CTABL, no wheezing, rales or other adventitious sounds auscultated  Frequent cough Abdominal:     General: There is no distension.  Musculoskeletal:        General: Normal range of motion.     Cervical back: Neck supple.  Skin:    General: Skin is warm and dry.  Neurological:     General: No focal deficit present.     Mental Status: He is alert and oriented to person, place, and time. Mental status is at baseline.     Cranial Nerves: No cranial nerve deficit.     Motor: No weakness.     Gait: Gait normal.      UC Treatments / Results  Labs (all labs ordered are listed, but only abnormal results are displayed) Labs Reviewed - No data to display  EKG   Radiology No results found.  Procedures Procedures (including critical care time)  Medications Ordered in UC Medications - No data to display  Initial Impression / Assessment and Plan / UC Course  I have reviewed the triage vital signs and the nursing notes.  Pertinent labs & imaging results that were available during my care of the patient were reviewed by me and considered in my medical decision making (see chart for details).     Given worsening headache and  occipital area with vomiting and dizziness given recent history recommending further evaluation in emergency room for potential repeat imaging as well as further blood work and chest x-ray to evaluate cough.  Cannot rule out any further intracranial abnormality contributing to symptoms at urgent care.  Patient verbalized understanding.  Stable on discharge, sent by private vehicle with family member.   Final Clinical Impressions(s) / UC Diagnoses  Final diagnoses:  Dizziness  Acute nonintractable headache, unspecified headache type     Discharge Instructions     Patient to emergency room    ED Prescriptions    None     PDMP not reviewed this encounter.   Lew Dawes, [place] 09/14/20 [phone]

## 2020-09-14 NOTE — ED Triage Notes (Signed)
Pt presents with dizziness, weakness, vomiting, and non productive cough; pt states he did not start having these symptoms until he was discharged from the hospital.

## 2020-09-14 NOTE — ED Triage Notes (Signed)
Pt endorses headache, dizziness, cough and vomiting. States he was discharged 11/2 after a stroke and referral to neurology.

## 2020-09-14 NOTE — ED Notes (Signed)
Pt transported to CT ?

## 2020-09-15 LAB — RESPIRATORY PANEL BY RT PCR (FLU A&B, COVID)
Influenza A by PCR: NEGATIVE
Influenza B by PCR: NEGATIVE
SARS Coronavirus 2 by RT PCR: NEGATIVE

## 2020-09-15 MED ORDER — PROCHLORPERAZINE MALEATE 10 MG PO TABS: 10.0000 mg | ORAL_TABLET | Freq: Two times a day (BID) | ORAL | 0 refills | Status: DC | PRN

## 2020-09-15 MED ORDER — PROCHLORPERAZINE EDISYLATE 10 MG/2ML IJ SOLN
10.0000 mg | Freq: Once | INTRAMUSCULAR | Status: AC
  Administered 2020-09-15: 10 mg via INTRAVENOUS
  Filled 2020-09-15: qty 2

## 2020-09-15 MED ORDER — LACTATED RINGERS IV BOLUS
1000.0000 mL | Freq: Once | INTRAVENOUS | Status: AC
  Administered 2020-09-15: 1000 mL via INTRAVENOUS

## 2020-09-15 NOTE — ED Provider Notes (Signed)
12:23 AM Assumed care from Dr. Denton Lank, please see their note for full history, physical and decision making until this point. In brief this is a 49 y.o. year old male who presented to the ED tonight with Headache and Dizziness     Patient was recently discharged from the hospital couple days ago after having a traumatic fall with a likely cerebral contusion.  Apparently had some persistent nausea and dizziness since that time.  But now also having some respiratory symptoms to include coughing.  He is pending CT scan of his head, respiratory panel and likely discharge.  CT unremarkable. Pending resp panel.  Workup unremarkable. Improve HA/emesis with compazine, rx for same. Neuro follow up. Stable for discharge.   Discharge instructions, including strict return precautions for new or worsening symptoms, given. Patient and/or family verbalized understanding and agreement with the plan as described.   Labs, studies and imaging reviewed by myself and considered in medical decision making if ordered. Imaging interpreted by radiology.  Labs Reviewed  BASIC METABOLIC PANEL - Abnormal; Notable for the following components:      Result Value   Glucose, Bld 104 (*)    Creatinine, Ser 1.31 (*)    All other components within normal limits  RESPIRATORY PANEL BY RT PCR (FLU A&B, COVID)  CBC    CT HEAD WO CONTRAST  Final Result      No follow-ups on file.    Coburn Knaus, Barbara Cower, MD 09/15/20 [phone]

## 2020-09-15 NOTE — ED Notes (Signed)
Pt ambulated too the restroom without assistance. No complaints noted.

## 2020-09-18 ENCOUNTER — Ambulatory Visit: Payer: Commercial Managed Care - PPO | Attending: Internal Medicine | Admitting: Physical Therapy

## 2020-09-18 ENCOUNTER — Other Ambulatory Visit: Payer: Self-pay

## 2020-09-18 VITALS — BP 115/81 | HR 83

## 2020-09-18 DIAGNOSIS — R2689 Other abnormalities of gait and mobility: Secondary | ICD-10-CM

## 2020-09-18 DIAGNOSIS — R42 Dizziness and giddiness: Secondary | ICD-10-CM

## 2020-09-18 DIAGNOSIS — R2681 Unsteadiness on feet: Secondary | ICD-10-CM

## 2020-09-18 LAB — VITAMIN B1: Vitamin B1 (Thiamine): 145.3 nmol/L (ref 66.5–200.0)

## 2020-09-18 NOTE — Therapy (Signed)
[facility] [address] [place], [place], [address] Phone: [phone]   Fax:  [phone]  Physical Therapy Evaluation  Patient Details  Name: Logan Dixon MRN: [id number] Date of Birth: Sep 05, 1971 Referring Provider (PT): Marvel Plan ((follow-up))   Encounter Date: 09/18/2020   PT End of Session - 09/18/20 0805    Visit Number 1    Number of Visits 6    Authorization Type UHC/UMR    PT Start Time 0805    PT Stop Time 0846    PT Time Calculation (min) 41 min    Activity Tolerance Patient tolerated treatment well    Behavior During Therapy [facility] for tasks assessed/performed           No past medical history on file.  No past surgical history on file.  Vitals:   09/18/20 0828  BP: 115/81  Pulse: 83      Subjective Assessment - 09/18/20 0808    Subjective Pt presented to ED 10/31 and couldn't walk "after the accident."  I was weak, but then I've gotten better since I've gotten home.  Not having the dizziness anymore like I had in the hospital.  No falls since coming home.  Went back to ED on 11/4 due to bad headache and dizziness. (Not noted in chart).    Patient Stated Goals Pt reports "not really having any goals for therapy."  At end of session, pt agrees to working on unsteadiness, balance    Currently in Pain? No/denies              [facility] PT Assessment - 09/18/20 0813      Assessment   Medical Diagnosis CVA    Referring Provider (PT) Marvel Plan   (follow-up)   Onset Date/Surgical Date 09/10/20    Hand Dominance Right      Precautions   Precautions Fall      Balance Screen   Has the patient fallen in the past 6 months Yes    How many times? 1   (requiring ED visit)   Has the patient had a decrease in activity level because of a fear of falling?  No    Is the patient reluctant to leave their home because of a fear of falling?  No      Home Tourist information centre manager  residence    Living Arrangements Spouse/significant other    Available Help at Discharge Family    Type of Home House    Home Access Level entry    Home Layout Two level;Bed/bath upstairs    Alternate Level Stairs-Number of Steps 7   then 7 more   Alternate Level Stairs-Rails Right    Home Equipment None      Prior Function   Level of Independence Independent    Vocation Full time employment    Education officer, environmental work; Standing      Observation/Other Assessments   Focus on Therapeutic Outcomes (FOTO)  FOTO intake score 82      Sensation   Light Touch Impaired by gross assessment    Additional Comments Reports and notes slight tingle, decreased sensation LLE (notes this is longer standing than recent CVA)      Posture/Postural Control   Posture/Postural Control No significant limitations      ROM / Strength   AROM / PROM / Strength Strength      Strength   Overall Strength Deficits    Overall Strength Comments Grossly  tested 4/5 R quads, 4/5 L ankle dorsiflexion 4+/5 bilat hip flexion; hamstrings, L quads and R ankle dorsiflexion 5/5      Transfers   Transfers Sit to Stand;Stand to Sit    Sit to Stand 7: Independent;From chair/3-in-1    Stand to Sit 7: Independent;To chair/3-in-1      Ambulation/Gait   Ambulation/Gait Yes    Ambulation/Gait Assistance 5: Supervision    Ambulation Distance (Feet) 250 Feet    Assistive device None    Gait Pattern Step-through pattern    Ambulation Surface Level;Indoor    Gait velocity 8.75 sec = 3.75 ft/sec      Standardized Balance Assessment   Standardized Balance Assessment Timed Up and Go Test      Timed Up and Go Test   Normal TUG (seconds) 11.12    TUG Comments Scores >13.5 sec indicate increased fall risk.      High Level Balance   High Level Balance Comments Able to stand on foam EO and EC solid and foam surface 30 sec no LOB      Functional Gait  Assessment   Gait assessed  Yes    Gait Level Surface  Walks 20 ft in less than 7 sec but greater than 5.5 sec, uses assistive device, slower speed, mild gait deviations, or deviates 6-10 in outside of the 12 in walkway width.   6.56   Change in Gait Speed Able to smoothly change walking speed without loss of balance or gait deviation. Deviate no more than 6 in outside of the 12 in walkway width.    Gait with Horizontal Head Turns Performs head turns smoothly with slight change in gait velocity (eg, minor disruption to smooth gait path), deviates 6-10 in outside 12 in walkway width, or uses an assistive device.   7.56   Gait with Vertical Head Turns Performs task with slight change in gait velocity (eg, minor disruption to smooth gait path), deviates 6 - 10 in outside 12 in walkway width or uses assistive device   7.91   Gait and Pivot Turn Pivot turns safely within 3 sec and stops quickly with no loss of balance.    Step Over Obstacle Is able to step over 2 stacked shoe boxes taped together (9 in total height) without changing gait speed. No evidence of imbalance.    Gait with Narrow Base of Support Is able to ambulate for 10 steps heel to toe with no staggering.    Gait with Eyes Closed Walks 20 ft, slow speed, abnormal gait pattern, evidence for imbalance, deviates 10-15 in outside 12 in walkway width. Requires more than 9 sec to ambulate 20 ft.   16.56   Ambulating Backwards Walks 20 ft, no assistive devices, good speed, no evidence for imbalance, normal gait    Steps Alternating feet, must use rail.    Total Score 24    FGA comment: Scores <22/30 indicate increased fall risk.                  Vestibular Assessment - 09/18/20 0001      Symptom Behavior   Subjective history of current problem Following ED visit, pt reports dizziness with acute PT team.  He reports dizziness is better now, but he does have medication for it.  Reports blurriness with vision at times.    Type of Dizziness  Blurred vision;Imbalance    Frequency of Dizziness  Occasional    Symptom Nature Variable   Pt does not rate  any dizziness during eval   Progression of Symptoms Better      Visual Acuity   Static Standing, Line 10    Dynamic Standing, Line 7   Reports feeling blurred upon completion             Objective measurements completed on examination: See above findings.               PT Education - 09/18/20 1511    Education Details Eval results, PT POC    Person(s) Educated Patient    Methods Explanation    Comprehension Verbalized understanding               PT Long Term Goals - 09/18/20 1521      PT LONG TERM GOAL #1   Title Pt will be independent with HEP for improved balance and gait.  TARGET 10/20/2020    Time 5    Period Weeks    Status New      PT LONG TERM GOAL #2   Title Pt will improve FGA score to at least 28/30 for improved balance, decreased fall risk.    Baseline FGA 24/30    Time 5    Period Weeks    Status New      PT LONG TERM GOAL #3   Title Pt will improve static/dymanic visual acuity to 2 or less lines difference, for improved visual/vestibular system use for balance.    Baseline 3 line difference    Time 5    Period Weeks    Status New      PT LONG TERM GOAL #4   Title Pt will negotiated at least 14 steps, one handrail, modified independently, for improved stair negotiation for home.    Time 5    Period Weeks    Status New      PT LONG TERM GOAL #5   Title Pt will demonstrate improved FOTO score by 5% for improved overall functional mobility upon d/c.    Baseline 82% intake    Time 5    Period Weeks    Status New                  Plan - 09/18/20 1513    Clinical Impression Statement Pt is a 49 year old male with past medical history notable for polysubstance abuse with cocaine, amphetamines, alcohol who presented to [facility] emergency department 09/10/2020, following fall with encephalopathy.  He fell backwards and hit the back of his head on the driveway.  At the  time of his fall, patient was intoxicated and had not awaken for the past 10 minutes.  No reported seizure-like activities.  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  He had history of dizziness (did not appear positional vertigo while in hospital).  He presents to OPPT, reporting resolved dizziness, though he reports some blurred vision.  He demonstrates decreased overall lower extremity strength, and decreased high level balance.  He previously was independent and worked at a [employer] (plans to return soon).  He will benefit from skilled PT to address the above stated deficits to improve overall functional mobility and return to independence.    Personal Factors and Comorbidities Behavior Pattern;Comorbidity 1    Comorbidities Hx of polysubstance abuse    Examination-Activity Limitations Locomotion Level    Examination-Participation Restrictions Occupation    Stability/Clinical Decision Making Stable/Uncomplicated    Clinical Decision Making Low    Rehab Potential Good  PT Frequency 1x / week    PT Duration Other (comment)   5 weeks, plus eval   PT Treatment/Interventions ADLs/Self Care Home Management;Neuromuscular re-education;Balance training;Therapeutic exercise;Therapeutic activities;Stair training;Gait training;Functional mobility training;Patient/family education;Vestibular    PT Next Visit Plan Further vestibular testing/visual testing as needed; initiate HEP-corner balance exercises, visual acuity (x1 and x 2 viewing); dynamic balance; stair negotiation    Consulted and Agree with Plan of Care Patient           Patient will benefit from skilled therapeutic intervention in order to improve the following deficits and impairments:  Abnormal gait, Dizziness, Decreased balance, Decreased strength  Visit Diagnosis: Other abnormalities of gait and mobility  Unsteadiness on feet  Dizziness and giddiness     Problem List Patient  Active Problem List   Diagnosis Date Noted  . Cerebral thrombosis with cerebral infarction 09/11/2020  . Polysubstance abuse (HCC) 09/10/2020    Meghin Thivierge W. 09/18/2020, 3:26 PM Gean MaidensMARRIOTT,Severo Beber W., PT  [facility] [address] [place], [place], [address] Phone: [phone]   Fax:  [phone]  Name: Logan Dixon MRN: [id number] Date of Birth: [birth year]

## 2020-09-20 ENCOUNTER — Ambulatory Visit: Payer: Commercial Managed Care - PPO | Admitting: Neurology

## 2020-09-20 ENCOUNTER — Encounter (HOSPITAL_COMMUNITY): Payer: Self-pay | Admitting: Emergency Medicine

## 2020-09-21 ENCOUNTER — Other Ambulatory Visit: Payer: Self-pay

## 2020-09-21 ENCOUNTER — Ambulatory Visit: Payer: Commercial Managed Care - PPO | Admitting: Physical Therapy

## 2020-09-21 DIAGNOSIS — R2689 Other abnormalities of gait and mobility: Secondary | ICD-10-CM | POA: Diagnosis not present

## 2020-09-21 DIAGNOSIS — R2681 Unsteadiness on feet: Secondary | ICD-10-CM

## 2020-09-21 DIAGNOSIS — R42 Dizziness and giddiness: Secondary | ICD-10-CM
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
## 2020-09-27 ENCOUNTER — Ambulatory Visit: Payer: Commercial Managed Care - PPO | Admitting: Physical Therapy

## 2020-09-27 ENCOUNTER — Other Ambulatory Visit: Payer: Self-pay

## 2020-09-27 DIAGNOSIS — R2689 Other abnormalities of gait and mobility: Secondary | ICD-10-CM
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
## 2020-10-03 ENCOUNTER — Ambulatory Visit: Payer: Commercial Managed Care - PPO | Admitting: Physical Therapy

## 2020-10-11 ENCOUNTER — Ambulatory Visit: Payer: Commercial Managed Care - PPO | Admitting: Physical Therapy

## 2020-10-16 ENCOUNTER — Encounter: Payer: Self-pay | Admitting: Adult Health

## 2020-10-16 ENCOUNTER — Ambulatory Visit (INDEPENDENT_AMBULATORY_CARE_PROVIDER_SITE_OTHER): Payer: Commercial Managed Care - PPO | Admitting: Adult Health

## 2020-10-16 ENCOUNTER — Other Ambulatory Visit: Payer: Self-pay

## 2020-10-16 VITALS — BP 130/77 | HR 107 | Ht 70.0 in | Wt 208.0 lb

## 2020-10-16 DIAGNOSIS — F1910 Other psychoactive substance abuse, uncomplicated: Secondary | ICD-10-CM

## 2020-10-16 DIAGNOSIS — I639 Cerebral infarction, unspecified: Secondary | ICD-10-CM

## 2020-10-16 DIAGNOSIS — E785 Hyperlipidemia, unspecified: Secondary | ICD-10-CM | POA: Diagnosis not present

## 2020-10-16 DIAGNOSIS — Z720 Tobacco use: Secondary | ICD-10-CM | POA: Diagnosis not present

## 2020-10-16 MED ORDER — ATORVASTATIN CALCIUM 40 MG PO TABS: 40.0000 mg | ORAL_TABLET | Freq: Every day | ORAL | 3 refills | Status: AC

## 2020-10-16 MED ORDER — ASPIRIN 81 MG PO TBEC: 81.0000 mg | DELAYED_RELEASE_TABLET | Freq: Every day | ORAL | 3 refills | Status: AC

## 2020-10-16 NOTE — Progress Notes (Signed)
I agree with the above plan 

## 2020-10-16 NOTE — Patient Instructions (Addendum)
Continue aspirin 81 mg daily  and atorvastatin 40 mg daily for secondary stroke prevention  Continue to follow up with PCP regarding cholesterol and blood pressure management  Maintain strict control of hypertension with blood pressure goal below 130/90 and cholesterol with LDL cholesterol (bad cholesterol) goal below 70 mg/dL.      Followup in the future with me in 4 months or call earlier if needed       Thank you for coming to see [place] at [facility]. I hope we have been able to provide you high quality care today.  You may receive a patient satisfaction survey over the next few weeks. We would appreciate your feedback and comments so that we may continue to improve ourselves and the health of our patients.     Stroke Prevention Some medical conditions and behaviors are associated with a higher chance of having a stroke. You can help prevent a stroke by making nutrition, lifestyle, and other changes, including managing any medical conditions you may have. What nutrition changes can be made?   Eat healthy foods. You can do this by: ? Choosing foods high in fiber, such as fresh fruits and vegetables and whole grains. ? Eating at least 5 or more servings of fruits and vegetables a day. Try to fill half of your plate at each meal with fruits and vegetables. ? Choosing lean protein foods, such as lean cuts of meat, poultry without skin, fish, tofu, beans, and nuts. ? Eating low-fat dairy products. ? Avoiding foods that are high in salt (sodium). This can help lower blood pressure. ? Avoiding foods that have saturated fat, trans fat, and cholesterol. This can help prevent high cholesterol. ? Avoiding processed and premade foods.  Follow your health care provider's specific guidelines for losing weight, controlling high blood pressure (hypertension), lowering high cholesterol, and managing diabetes. These may include: ? Reducing your daily calorie intake. ? Limiting  your daily sodium intake to 1,500 milligrams (mg). ? Using only healthy fats for cooking, such as olive oil, canola oil, or sunflower oil. ? Counting your daily carbohydrate intake. What lifestyle changes can be made?  Maintain a healthy weight. Talk to your health care provider about your ideal weight.  Get at least 30 minutes of moderate physical activity at least 5 days a week. Moderate activity includes brisk walking, biking, and swimming.  Do not use any products that contain nicotine or tobacco, such as cigarettes and e-cigarettes. If you need help quitting, ask your health care provider. It may also be helpful to avoid exposure to secondhand smoke.  Limit alcohol intake to no more than 1 drink a day for nonpregnant women and 2 drinks a day for men. One drink equals 12 oz of beer, 5 oz of wine, or 1 oz of hard liquor.  Stop any illegal drug use.  Avoid taking birth control pills. Talk to your health care provider about the risks of taking birth control pills if: ? You are over 38 years old. ? You smoke. ? You get migraines. ? You have ever had a blood clot. What other changes can be made?  Manage your cholesterol levels. ? Eating a healthy diet is important for preventing high cholesterol. If cholesterol cannot be managed through diet alone, you may also need to take medicines. ? Take any prescribed medicines to control your cholesterol as told by your health care provider.  Manage your diabetes. ? Eating a healthy diet and exercising regularly are important parts of  managing your blood sugar. If your blood sugar cannot be managed through diet and exercise, you may need to take medicines. ? Take any prescribed medicines to control your diabetes as told by your health care provider.  Control your hypertension. ? To reduce your risk of stroke, try to keep your blood pressure below 130/80. ? Eating a healthy diet and exercising regularly are an important part of controlling your  blood pressure. If your blood pressure cannot be managed through diet and exercise, you may need to take medicines. ? Take any prescribed medicines to control hypertension as told by your health care provider. ? Ask your health care provider if you should monitor your blood pressure at home. ? Have your blood pressure checked every year, even if your blood pressure is normal. Blood pressure increases with age and some medical conditions.  Get evaluated for sleep disorders (sleep apnea). Talk to your health care provider about getting a sleep evaluation if you snore a lot or have excessive sleepiness.  Take over-the-counter and prescription medicines only as told by your health care provider. Aspirin or blood thinners (antiplatelets or anticoagulants) may be recommended to reduce your risk of forming blood clots that can lead to stroke.  Make sure that any other medical conditions you have, such as atrial fibrillation or atherosclerosis, are managed. What are the warning signs of a stroke? The warning signs of a stroke can be easily remembered as BEFAST.  B is for balance. Signs include: ? Dizziness. ? Loss of balance or coordination. ? Sudden trouble walking.  E is for eyes. Signs include: ? A sudden change in vision. ? Trouble seeing.  F is for face. Signs include: ? Sudden weakness or numbness of the face. ? The face or eyelid drooping to one side.  A is for arms. Signs include: ? Sudden weakness or numbness of the arm, usually on one side of the body.  S is for speech. Signs include: ? Trouble speaking (aphasia). ? Trouble understanding.  T is for time. ? These symptoms may represent a serious problem that is an emergency. Do not wait to see if the symptoms will go away. Get medical help right away. Call your local emergency services (911 in the [place] Do not drive yourself to the hospital.  Other signs of stroke may include: ? A sudden, severe headache with no known cause. ?  Nausea or vomiting. ? Seizure. Where to find more information For more information, visit:  American Stroke Association: www.strokeassociation.org  National Stroke Association: www.stroke.org Summary  You can prevent a stroke by eating healthy, exercising, not smoking, limiting alcohol intake, and managing any medical conditions you may have.  Do not use any products that contain nicotine or tobacco, such as cigarettes and e-cigarettes. If you need help quitting, ask your health care provider. It may also be helpful to avoid exposure to secondhand smoke.  Remember BEFAST for warning signs of stroke. Get help right away if you or a loved one has any of these signs. This information is not intended to replace advice given to you by your health care provider. Make sure you discuss any questions you have with your health care provider. Document Revised: 10/10/2017 Document Reviewed: 12/03/2016 Elsevier Patient Education  2020 [employer].

## 2020-10-16 NOTE — Progress Notes (Signed)
[facility] [address] [place]. [place] [address] [phone]       HOSPITAL FOLLOW UP NOTE  Logan Dixon Date of Birth:  1971/09/29 Medical Record Number:  [id number]   Reason for Referral:  hospital stroke follow up    SUBJECTIVE:   CHIEF COMPLAINT:  Chief Complaint  Patient presents with  . Hospitalization Follow-up    rm 9  . Cerebrovascular Accident    pt is not having any new sx since the stroke    HPI:   Logan Dixon is a 49 y.o. male with history of polysubstance abuse who presented on 09/11/2019 found obtunded w/ hypoxia in setting of encephalopathy likely secondary to substance abuse requiring Narcan.  Personally reviewed hospitalization pertinent progress notes, lab work and imaging with summary provided.  Evaluated by Dr. Roda Shutters with stroke work-up revealing right punctate MCA/PCA (occipital) infarct likely secondary to small vessel disease source in setting of polysubstance abuse.  Initiated aspirin 81 mg daily for secondary stroke prevention.  LDL 131 and initiated atorvastatin 40 mg daily.  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  Current tobacco use with smoking cessation counseling provided.  Other active problems include obesity but no prior stroke history.  Stroke:   R punctate MCA/PCA infarct likely small vessel disease in setting of polysubstance abuse  CT head No acute abnormality.   MRI  Punctate R occipital infarct. Mild to moderate small vessel disease. Posterior scalp contusion/lac  MRA  Unremarkable   Carotid Doppler  B ICA 1-39% stenosis, VAs antegrade   2D Echo EF 55-60%  LDL 131  HgbA1c 5.6   VTE prophylaxis - Lovenox 40 mg sq daily   No antithrombotic prior to admission, now on aspirin 81 mg daily. Continue on discharge.  Therapy recommendations:  HH PT  Disposition:   Home  Today,  10/16/2020, Logan Dixon is being seen for hospital follow-up accompanied by his girlfriend. Evaluated at urgent care on 09/14/2020 for occipital headaches, dizziness, nausea and cough and further evaluated in the ED.  Work-up unremarkable and improvement after Compazine.  He has been doing well since that time without recurrence of headaches or dizziness.  He does report occasional blurred vision greater in the morning and later at night which he did not experience pre-stroke.  This has not interfered with daily activity or functioning.  Returned back to work without difficulty.  Completed PT meeting all LTG's.  Denies new stroke/TIA symptoms.  Remains on aspirin 81 mg daily without bleeding or bruising.  Remains on atorvastatin 40 mg daily without myalgias.  Blood pressure today 130/77.  Denies any additional polysubstance abuse.  Continued tobacco use with decreasing daily amount.  No concerns at this time.    ROS:   14 system review of systems performed and negative with exception of those listed in HPI  PMH:  Past Medical History:  Diagnosis Date  . 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 Acute respiratory failure (HCC)   . Ischemic stroke (HCC)     PSH: No past surgical history on file.  Social History:  Social History   Socioeconomic History  . Marital status: Divorced    Spouse name: Not on file  . Number of children: Not on file  . Years of education: Not on file  . Highest education level: Not on file  Occupational History  . Not on file  Tobacco  Use  . Smoking status: Current Every Day Smoker    Packs/day: 0.50    Types: Cigarettes  . Smokeless tobacco: Never Used  Substance and Sexual Activity  . Alcohol use: Yes    Alcohol/week: 21.0 standard drinks    Types: 21 Cans of beer per week  . Drug use: Yes    Types: Marijuana  . Sexual activity: Not on file  Other Topics Concern  . Not on file  Social History Narrative   ** Merged History Encounter **       Social  Determinants of Health   Financial Resource Strain:   . Difficulty of Paying Living Expenses: Not on file  Food Insecurity:   . Worried About Programme researcher, broadcasting/film/video in the Last Year: Not on file  . Ran Out of Food in the Last Year: Not on file  Transportation Needs:   . Lack of Transportation (Medical): Not on file  . Lack of Transportation (Non-Medical): Not on file  Physical Activity:   . Days of Exercise per Week: Not on file  . Minutes of Exercise per Session: Not on file  Stress:   . Feeling of Stress : Not on file  Social Connections:   . Frequency of Communication with Friends and Family: Not on file  . Frequency of Social Gatherings with Friends and Family: Not on file  . Attends Religious Services: Not on file  . Active Member of Clubs or Organizations: Not on file  . Attends Banker Meetings: Not on file  . Marital Status: Not on file  Intimate Partner Violence:   . Fear of Current or Ex-Partner: Not on file  . Emotionally Abused: Not on file  . Physically Abused: Not on file  . Sexually Abused: Not on file    Family History:  Family History  Family history unknown: Yes    Medications:   No current outpatient medications on file prior to visit.   No current facility-administered medications on file prior to visit.    Allergies:  No Known Allergies    OBJECTIVE:  Physical Exam  Vitals:   10/16/20 1353  BP: 130/77  Pulse: (!) 107  Weight: 208 lb (94.3 kg)  Height: 5\' 10"  (1.778 m)   Body mass index is 29.84 kg/m. No exam data present  Depression screen [facility] 2/9 10/16/2020  Decreased Interest 0  Down, Depressed, Hopeless 0  PHQ - 2 Score 0     General: well developed, well nourished,  pleasant middle-aged African-American male, seated, in no evident distress Head: head normocephalic and atraumatic.   Neck: supple with no carotid or supraclavicular bruits Cardiovascular: regular rate and rhythm, no murmurs Musculoskeletal: no  deformity Skin:  no rash/petichiae Vascular:  Normal pulses all extremities   Neurologic Exam Mental Status: Awake and fully alert.   Fluent speech and language.  Oriented to place and time. Recent and remote memory intact. Attention span, concentration and fund of knowledge appropriate. Mood and affect appropriate.  Cranial Nerves: Fundoscopic exam reveals sharp disc margins. Pupils equal, briskly reactive to light. Extraocular movements full without nystagmus. Visual fields full to confrontation. Hearing intact. Facial sensation intact. Face, tongue, palate moves normally and symmetrically.  Motor: Normal bulk and tone. Normal strength in all tested extremity muscles. Sensory.: intact to touch , pinprick , position and vibratory sensation.  Coordination: Rapid alternating movements normal in all extremities. Finger-to-nose and heel-to-shin performed accurately bilaterally. Gait and Station: Arises from chair without difficulty. Stance is normal.  Gait demonstrates normal stride length and balance without use of AD.  Able to tandem walk and heel toe with mild difficulty.  Romberg negative. Reflexes: 1+ and symmetric. Toes downgoing.     NIHSS  0 Modified Rankin  1      ASSESSMENT: Logan Dixon is a 49 y.o. year old male presented on 09/10/2020 after being found down in driveway obtunded with hypoxia in setting of encephalopathy likely secondary to substance abuse requiring Narcan with stroke work-up revealing right punctate MCA/PCA (occipital) infarct likely secondary to small vessel disease in setting of polysubstance abuse (EtOH, amphetamine and cocaine). Vascular risk factors include polysubstance abuse, HLD, tobacco use and obesity.      PLAN:  1. R MCA/PCA stroke :  a. Residual deficit: Subjective intermittent blurred vision.  In the process of establishing care with ophthalmology which was encouraged for further evaluation as this could be residual stroke deficit but may also  be due to underlying eye condition or abnormality b. Continue aspirin 81 mg daily  and atorvastatin 40 mg daily for secondary stroke prevention.  c. Discussed secondary stroke prevention measures and advise to ensure he establishes care with PCP for routine follow up for aggressive stroke risk factor management  2. HLD: LDL goal <70. Recent LDL 131.  Initiated atorvastatin 40 mg daily during stroke admission.  Refill provided today. Will plan on repeat lipid panel at f/u visit unless already completed prior to that time -currently in the process of establishing care with PCP 3. Polysubstance abuse: Congratulated and encouraged continued abstinence for secondary stroke prevention 4. Tobacco abuse: Congratulated on decreasing daily amount but discussed importance of complete cessation for secondary stroke prevention    Follow up in 4 months or call earlier if needed   I spent 45 minutes of face-to-face and non-face-to-face time with patient and girlfriend.  This included previsit chart review including recent hospitalization pertinent progress notes, lab work and imaging, lab review, study review, order entry, electronic health record documentation, patient education regarding recent stroke and etiology, residual deficits, importance of managing stroke risk factors and answered all questions to patient and girlfriends satisfaction   Logan Dixon, AGNP-BC  [facility] [address] [place], [place] [address]  Phone [phone] Fax [phone] Note: This document was prepared with digital dictation and possible smart phrase technology. Any transcriptional errors that result from this process are unintentional.

## 2020-10-18 ENCOUNTER — Ambulatory Visit: Payer: Commercial Managed Care - PPO | Admitting: Physical Therapy

## 2021-02-14 ENCOUNTER — Encounter: Payer: Self-pay | Admitting: Adult Health

## 2021-02-14 ENCOUNTER — Ambulatory Visit: Payer: Commercial Managed Care - PPO | Admitting: Adult Health

## 2022-01-28 ENCOUNTER — Encounter (HOSPITAL_COMMUNITY): Payer: Self-pay

## 2022-01-28 ENCOUNTER — Ambulatory Visit (HOSPITAL_COMMUNITY)
Admission: EM | Admit: 2022-01-28 | Discharge: 2022-01-28 | Disposition: A | Discharge status: Home / Self Care | Payer: Commercial Managed Care - PPO | Attending: Nurse Practitioner | Admitting: Nurse Practitioner

## 2022-01-28 DIAGNOSIS — H60391 Other infective otitis externa, right ear: Secondary | ICD-10-CM | POA: Diagnosis not present

## 2022-01-28 MED ORDER — CIPROFLOXACIN-DEXAMETHASONE 0.3-0.1 % OT SUSP: 4.0000 [drp] | Freq: Two times a day (BID) | OTIC | 0 refills | Status: DC

## 2022-01-28 MED ORDER — NEOMYCIN-POLYMYXIN-HC 3.5-10000-1 OT SUSP: 4.0000 [drp] | Freq: Three times a day (TID) | OTIC | 0 refills | Status: AC

## 2022-01-28 NOTE — ED Triage Notes (Signed)
Pt presents with rt ear pain and swelling that began Friday ?

## 2022-01-28 NOTE — Discharge Instructions (Addendum)
-   Please put 4 drops in your right ear twice daily for 7 days ?- If your symptoms do not improve while taking the ear drops, please call the ear nose and throat doctor - phone number is listed below ?- Once the ear pain is better, please start using ear plugs at work ?- Please do not use Q-tips ?

## 2022-01-28 NOTE — ED Provider Notes (Signed)
?[facility] ? ? ? ?CSN: [id number] ?Arrival date & time: 01/28/22  1034 ? ? ?  ? ?History   ?Chief Complaint ?Chief Complaint  ?Patient presents with  ? Otalgia  ? ? ?HPI ?Memphis Decoteau is a 51 y.o. male.  ? ?Patient reports 3 day history of ear pain.  He denies hearing loss and drainage from the ear although he does endorse ear swelling and pain in front of and around the ear. He works in an environment with a lot of dust and does not wear ear plugs to protect his ears.  He denies use of Q-tips or recent swimming under water.  ? ? ?Past Medical History:  ?Diagnosis Date  ? Accidental poisoning by drug   ? Acute respiratory failure (HCC)   ? Ischemic stroke (HCC)   ? ? ?Patient Active Problem List  ? Diagnosis Date Noted  ? Cerebral thrombosis with cerebral infarction 09/11/2020  ? Polysubstance abuse (HCC) 09/10/2020  ? ? ?History reviewed. No pertinent surgical history. ? ? ? ? ?Home Medications   ? ?Prior to Admission medications   ?Medication Sig Start Date End Date Taking? Authorizing Provider  ?ciprofloxacin-dexamethasone (CIPRODEX) OTIC suspension Place 4 drops into the right ear 2 (two) times daily. 01/28/22  Yes Valentino Nose, NP  ?aspirin 81 MG EC tablet Take 1 tablet (81 mg total) by mouth daily. Swallow whole. ?Patient not taking: Reported on 01/28/2022 10/16/20   Ihor Austin, NP  ?atorvastatin (LIPITOR) 40 MG tablet Take 1 tablet (40 mg total) by mouth daily. 10/16/20   Ihor Austin, NP  ? ? ?Family History ?Family History  ?Family history unknown: Yes  ? ? ?Social History ?Social History  ? ?Tobacco Use  ? Smoking status: Every Day  ?  Packs/day: 0.50  ?  Types: Cigarettes  ? Smokeless tobacco: Never  ?Substance Use Topics  ? Alcohol use: Yes  ?  Alcohol/week: 21.0 standard drinks  ?  Types: 21 Cans of beer per week  ? Drug use: Yes  ?  Types: Marijuana  ? ? ? ?Allergies   ?Patient has no known allergies. ? ? ?Review of Systems ?Review of Systems ?Per HPI ? ?Physical Exam ?Triage  Vital Signs ?ED Triage Vitals  ?Enc Vitals Group  ?   BP 01/28/22 1222 (!) 148/82  ?   Pulse Rate 01/28/22 1222 91  ?   Resp 01/28/22 1222 14  ?   Temp 01/28/22 1222 98 ?F (36.7 ?C)  ?   Temp Source 01/28/22 1222 Oral  ?   SpO2 01/28/22 1222 100 %  ?   Weight --   ?   Height --   ?   Head Circumference --   ?   Peak Flow --   ?   Pain Score 01/28/22 1224 6  ?   Pain Loc --   ?   Pain Edu? --   ?   Excl. in GC? --   ? ?No data found. ? ?Updated Vital Signs ?BP (!) 148/82 (BP Location: Left Arm)   Pulse 91   Temp 98 ?F (36.7 ?C) (Oral)   Resp 14   SpO2 100%  ? ?Visual Acuity ?Right Eye Distance:   ?Left Eye Distance:   ?Bilateral Distance:   ? ?Right Eye Near:   ?Left Eye Near:    ?Bilateral Near:    ? ?Physical Exam ?Vitals and nursing note reviewed.  ?Constitutional:   ?   General: He is not in acute distress. ?  Appearance: Normal appearance. He is not toxic-appearing.  ?HENT:  ?   Head: Normocephalic and atraumatic.  ?   Right Ear: Hearing and tympanic membrane normal. No decreased hearing noted. Swelling and tenderness present. No drainage. No middle ear effusion. There is mastoid tenderness. Tympanic membrane is not perforated or erythematous.  ?   Left Ear: Hearing, tympanic membrane, ear canal and external ear normal. Tympanic membrane is not erythematous.  ?   Nose: Rhinorrhea present. No congestion.  ?   Mouth/Throat:  ?   Mouth: Mucous membranes are moist.  ?   Pharynx: Oropharynx is clear. No oropharyngeal exudate or posterior oropharyngeal erythema.  ?Eyes:  ?   General: No scleral icterus. ?   Extraocular Movements: Extraocular movements intact.  ?Pulmonary:  ?   Effort: Pulmonary effort is normal. No respiratory distress.  ?Skin: ?   General: Skin is warm and dry.  ?   Coloration: Skin is not jaundiced or pale.  ?   Findings: No erythema.  ?Neurological:  ?   Mental Status: He is alert and oriented to person, place, and time.  ? ? ? ?UC Treatments / Results  ?Labs ?(all labs ordered are listed, but  only abnormal results are displayed) ?Labs Reviewed - No data to display ? ?EKG ? ? ?Radiology ?No results found. ? ?Procedures ?Procedures (including critical care time) ? ?Medications Ordered in UC ?Medications - No data to display ? ?Initial Impression / Assessment and Plan / UC Course  ?I have reviewed the triage vital signs and the nursing notes. ? ?Pertinent labs & imaging results that were available during my care of the patient were reviewed by me and considered in my medical decision making (see chart for details). ? ?  ?Treat acute otitis externa with Ciprodex 4 drops twice daily for 7 days.  Follow up with ENT if symptoms persist or worsen despite otic drops.  Encouraged use of ear plugs after right ear is back to normal at work.  Note given for work.  ?Final Clinical Impressions(s) / UC Diagnoses  ? ?Final diagnoses:  ?Other infective acute otitis externa of right ear  ? ? ? ?Discharge Instructions   ? ?  ?- Please put 4 drops in your right ear twice daily for 7 days ?- If your symptoms do not improve while taking the ear drops, please call the ear nose and throat doctor - phone number is listed below ?- Once the ear pain is better, please start using ear plugs at work ?- Please do not use Q-tips ? ? ? ? ?ED Prescriptions   ? ? Medication Sig Dispense Auth. Provider  ? ciprofloxacin-dexamethasone (CIPRODEX) OTIC suspension Place 4 drops into the right ear 2 (two) times daily. 7.5 mL Valentino Nose, NP  ? ?  ? ?PDMP not reviewed this encounter. ?  ?Valentino Nose, NP ?01/28/22 1242 ? ?

## 2022-03-10 IMAGING — CT CT HEAD W/O CM
4 series · 17 of 47 positions shown, 19 images · non-contrast
Comparison: None.

CLINICAL DATA: Status post trauma.

EXAM:
CT HEAD WITHOUT CONTRAST
TECHNIQUE: Contiguous axial images were obtained from the base of the skull
through the vertex without intravenous contrast.

[Series 3: head without · axial · non-contrast · 0.46mm/px · z∈[-115,+35]mm · 7 of 42 slices shown, 9 images]
[im 6/42  brain]
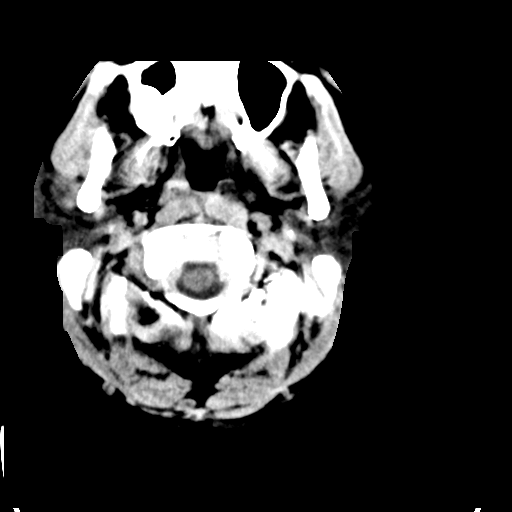
[im 6/42  bone]
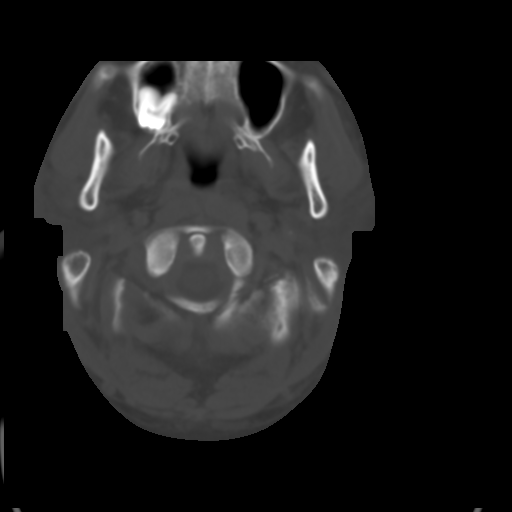
[im 11/42  brain]
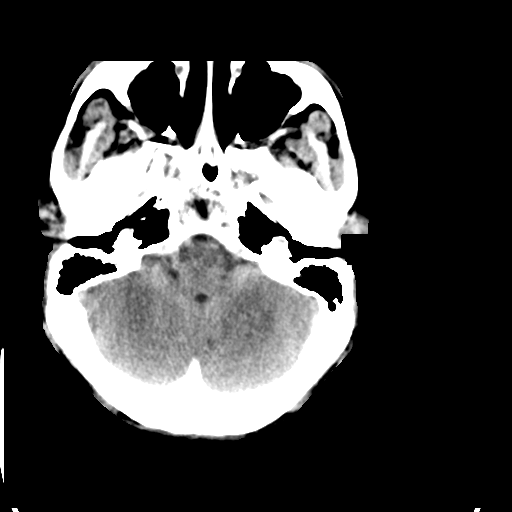
[im 16/42  brain]
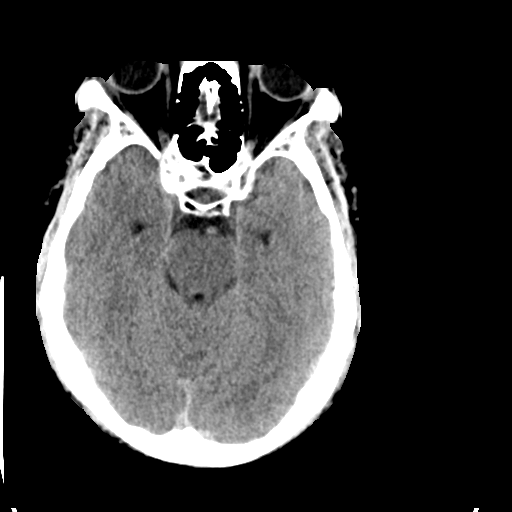
[im 21/42  brain]
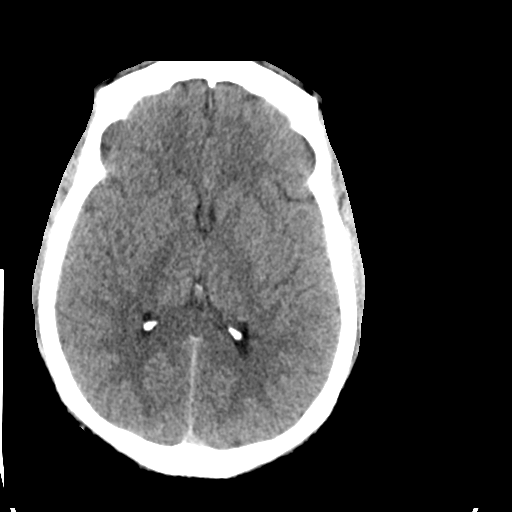
[im 26/42  brain]
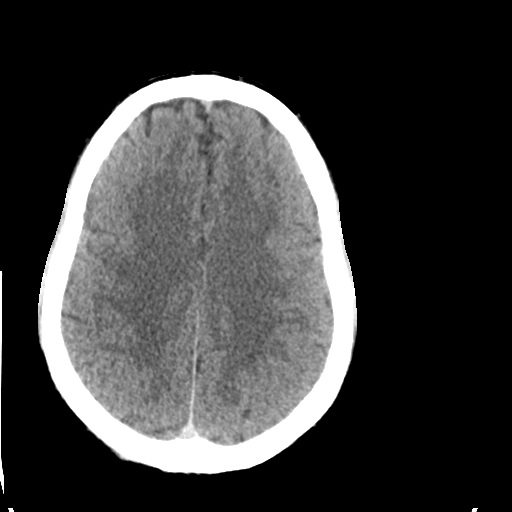
[im 26/42  bone]
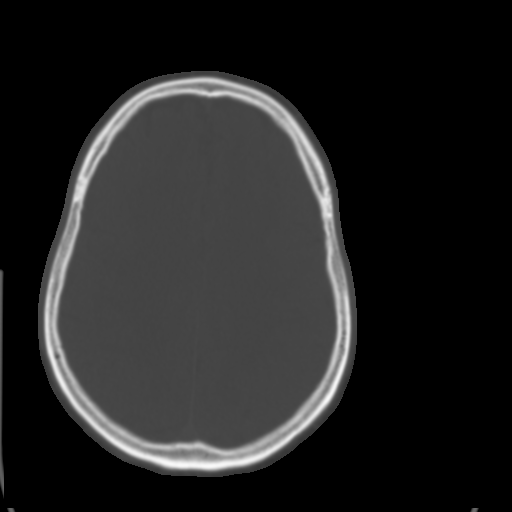
[im 31/42  brain]
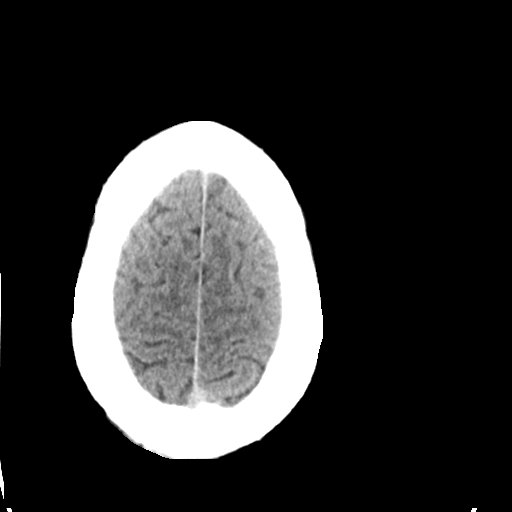
[im 36/42  brain]
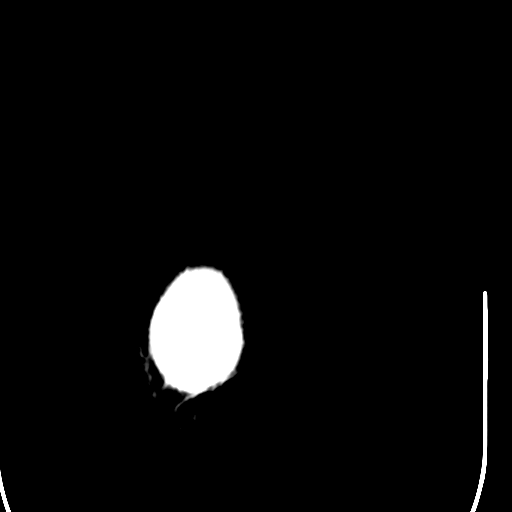

[Series 4: head bone · axial · 0.46mm/px · z∈[-120,-48]mm · 4 of 104 slices shown]
[im 11/104  bone]
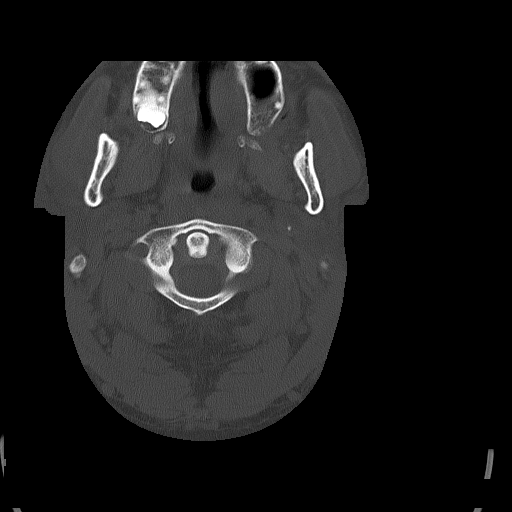
[im 21/104  bone]
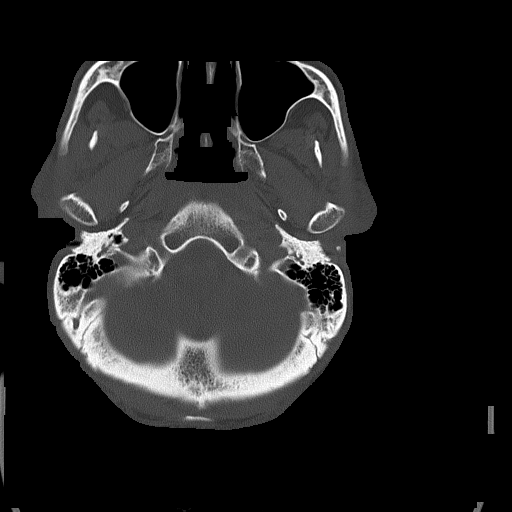
[im 31/104  bone]
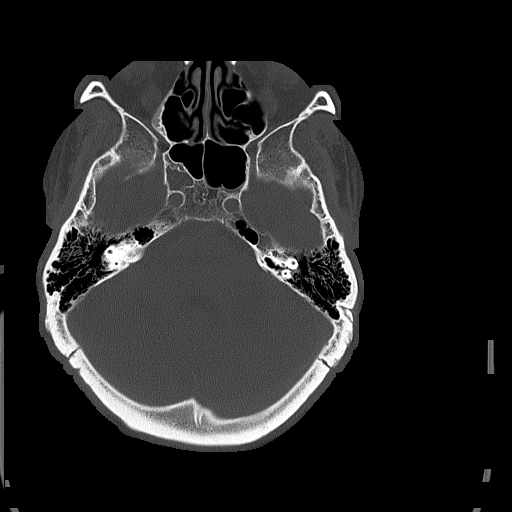
[im 47/104  bone]
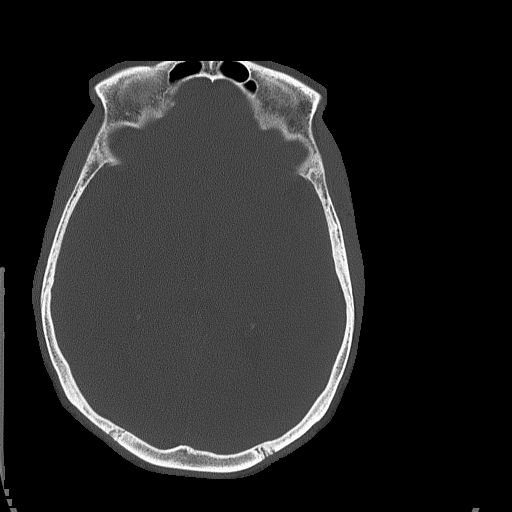

[Series 5: head without cor · coronal · non-contrast · 0.36mm/px · 3 of 75 slices shown]
[im 25/75  brain]
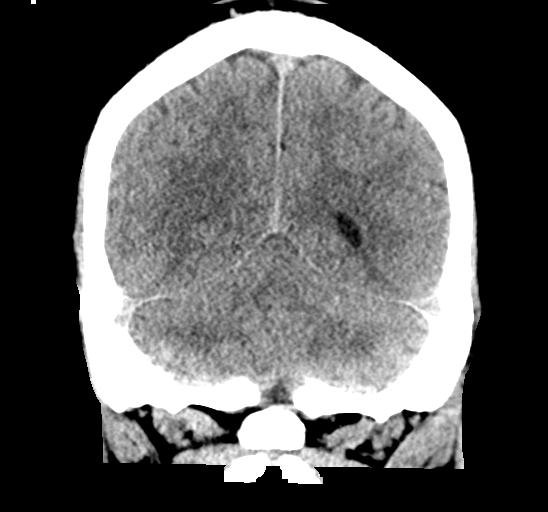
[im 33/75  brain]
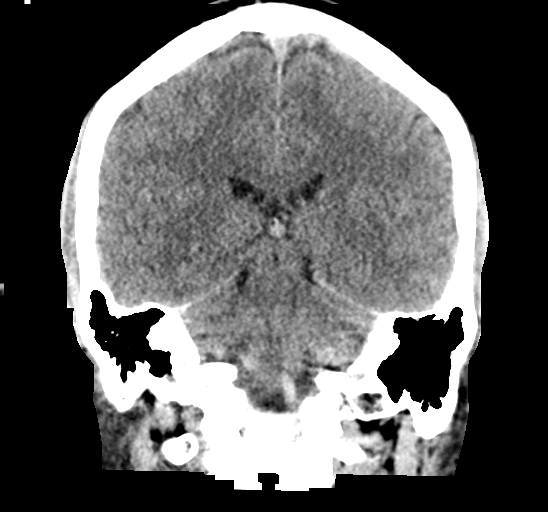
[im 42/75  brain]
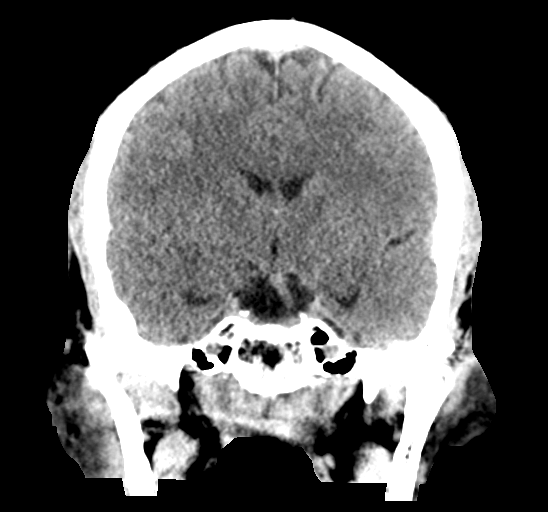

[Series 6: head without sag · sagittal · non-contrast · 0.40mm/px · 3 of 67 slices shown]
[im 23/67  brain]
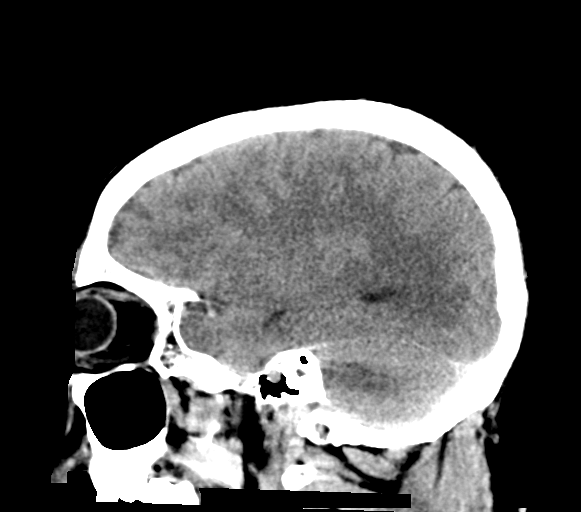
[im 34/67  brain]
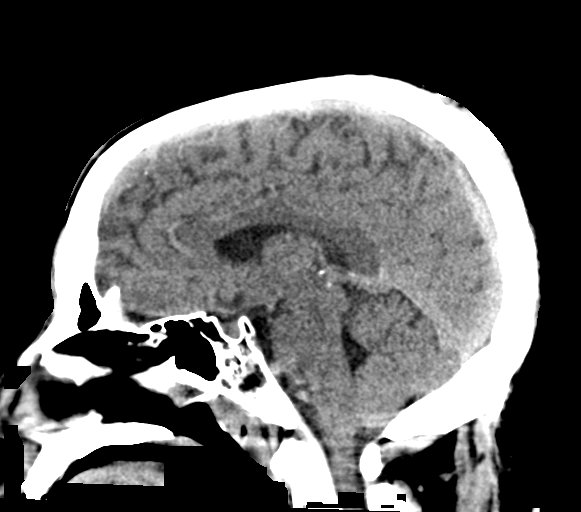
[im 45/67  brain]
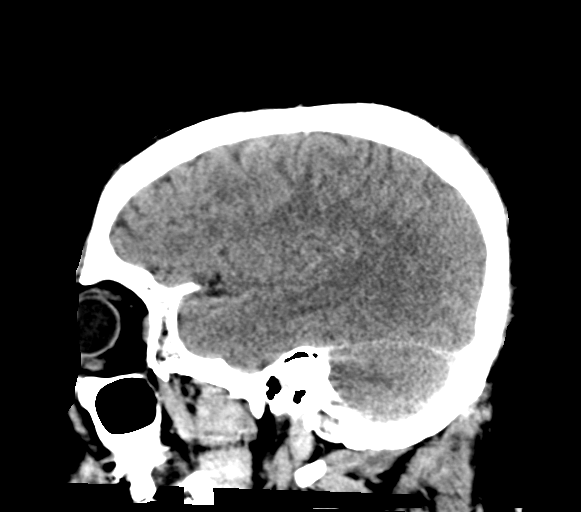

[17 of 47 positions shown; findings below may reference images not displayed]

FINDINGS: Brain: No evidence of acute infarction, hemorrhage, hydrocephalus,
extra-axial collection or mass lesion/mass effect.

Vascular: No hyperdense vessel or unexpected calcification.

Skull: Normal. Negative for fracture or focal lesion.

Sinuses/Orbits: No acute finding.

Other: Mild scalp soft tissue swelling is seen along the posterior
aspect of the vertex, to the right of midline.
IMPRESSION: 1. No acute intracranial abnormality.

## 2022-03-10 IMAGING — MR MR HEAD W/O CM
14 of 16 series · 34 of 48 positions shown · non-contrast
Comparison: Prior CT from 09/10/2020.

CLINICAL DATA: Initial evaluation for acute altered mental status.

EXAM:
MRI HEAD WITHOUT CONTRAST
TECHNIQUE: Multiplanar, multiecho pulse sequences of the brain and surrounding
structures were obtained without intravenous contrast.

[Series 5: DWI · axial · 3.0mm · 0.88mm/px · z∈[-71,+75]mm · 5 of 100 slices shown (1 of 4)]
[im 1/100]
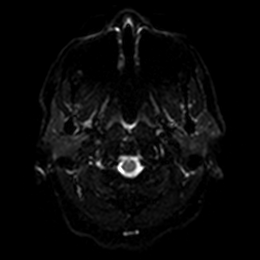
[im 25/100]
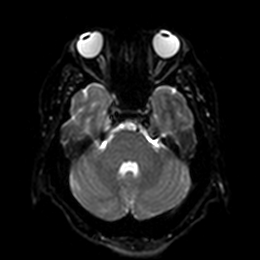
[im 50/100]
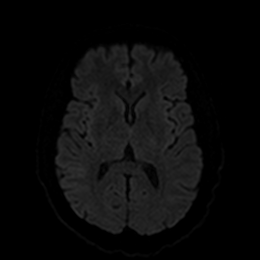
[im 75/100]
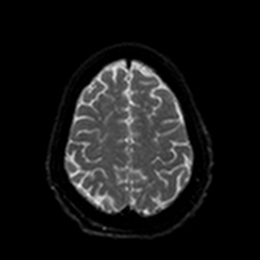
[im 100/100]
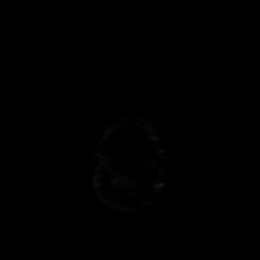

[Series 6: DWI · axial · 3.0mm · 0.88mm/px · z∈[-71,+75]mm · 3 of 50 slices shown (2 of 4)]
[im 1/50]
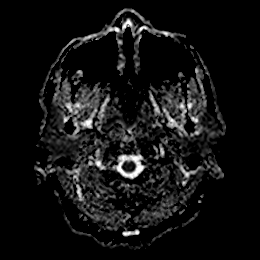
[im 25/50]
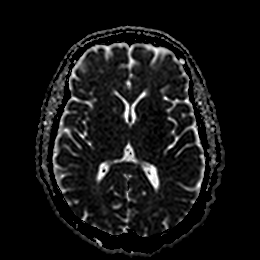
[im 50/50]
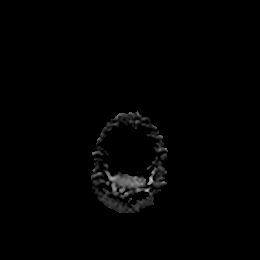

[Series 7: DWI · coronal · 4.0mm · 0.88mm/px · 3 of 70 slices shown (3 of 4)]
[im 1/70]
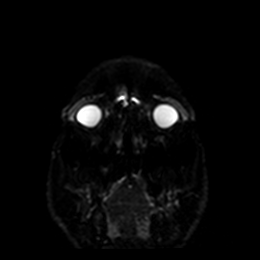
[im 35/70]
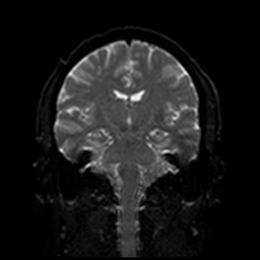
[im 70/70]
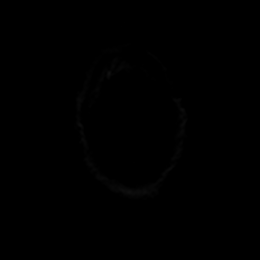

[Series 8: DWI · coronal · 4.0mm · 0.88mm/px · 1 of 35 slices shown (4 of 4)]
[im 1/35]
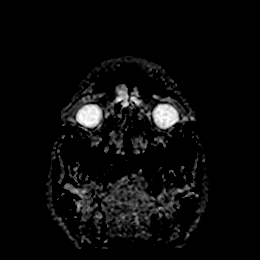

[Series 9: T1 · sagittal · 5.0mm · 0.75mm/px · 1 of 25 slices shown]
[im 1/25]
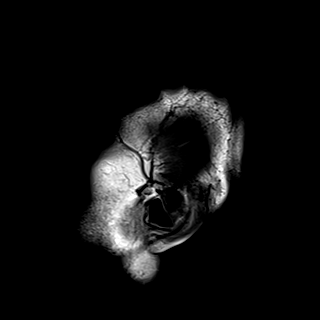

[Series 10: T2 · axial · 5.0mm · 0.72mm/px · 1 of 27 slices shown (1 of 2)]
[im 1/27]
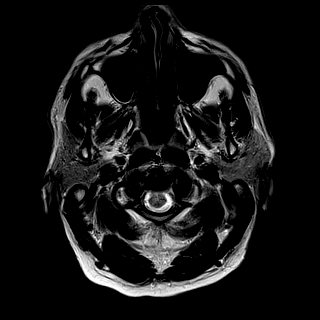

[Series 11: FLAIR · axial · 5.0mm · 0.45mm/px · 1 of 27 slices shown]
[im 1/27]
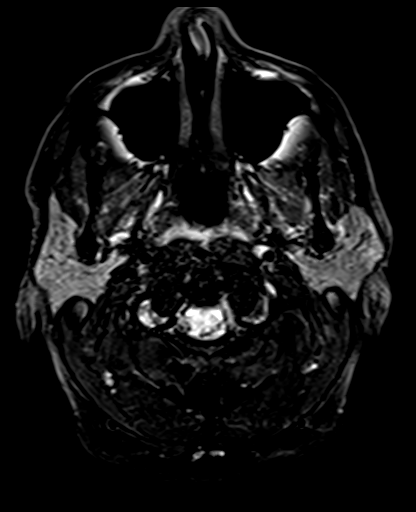

[Series 12: mag_images · axial · 3.0mm · 0.90mm/px · z∈[-81,+96]mm · 2 of 60 slices shown]
[im 1/60]
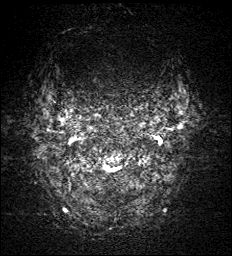
[im 60/60]
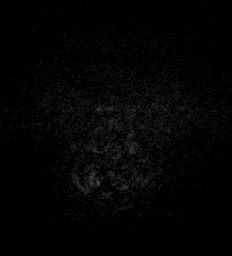

[Series 13: pha_images · axial · 3.0mm · 0.90mm/px · z∈[-78,+90]mm · 2 of 57 slices shown]
[im 1/57]
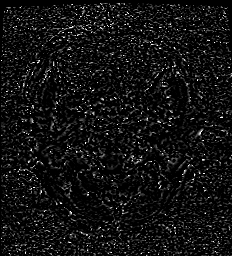
[im 57/57]
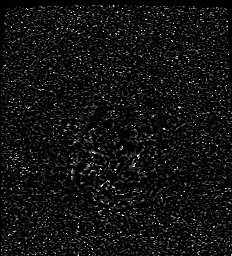

[Series 14: swi_images · axial · 3.0mm · 0.90mm/px · z∈[-81,+96]mm · 2 of 60 slices shown]
[im 1/60]
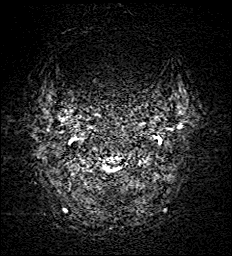
[im 60/60]
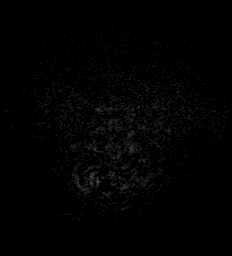

[Series 15: mip_images(sw) · axial · 24.0mm · 0.90mm/px · z∈[-70,+85]mm · 2 of 53 slices shown]
[im 1/53]
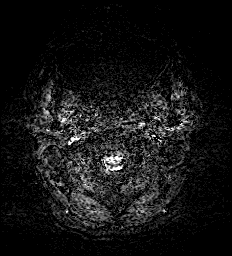
[im 53/53]
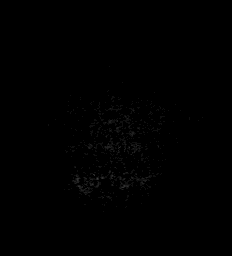

[Series 17: T2 · coronal · 5.0mm · 0.34mm/px · 1 of 31 slices shown (2 of 2)]
[im 1/31]
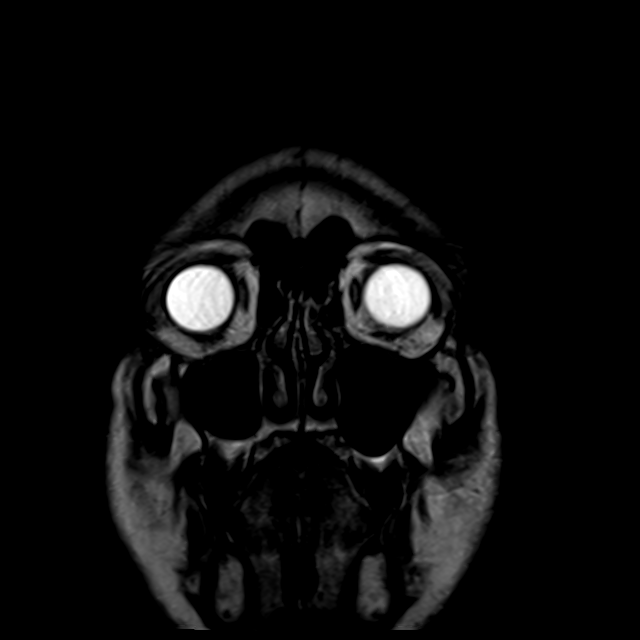

[Series 18: t2_space_dark-fluid_sag_p2_ns-ir · sagittal · 1.0mm · 0.49mm/px · 7 of 176 slices shown]
[im 1/176]
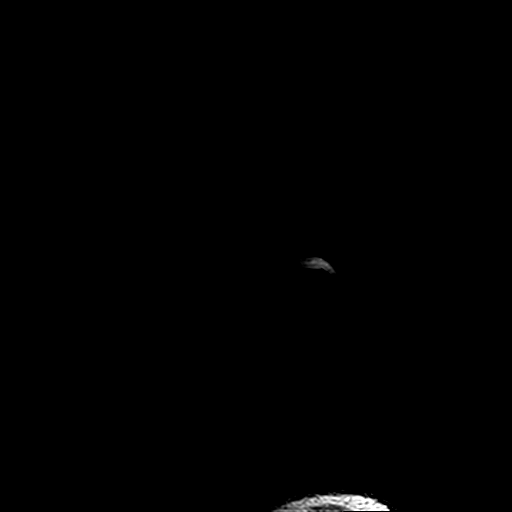
[im 30/176]
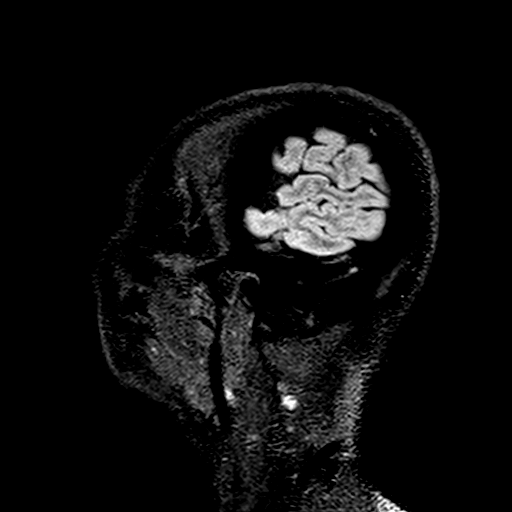
[im 59/176]
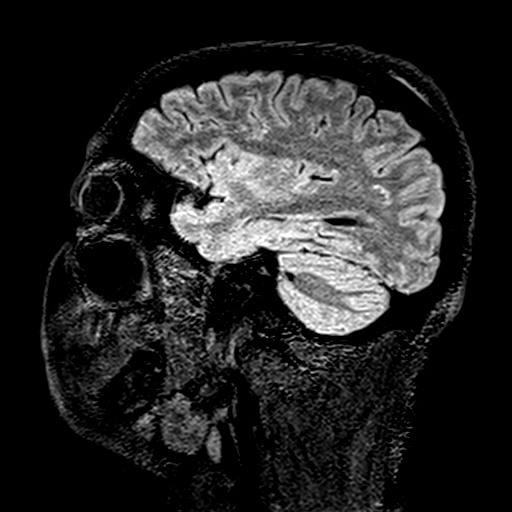
[im 88/176]
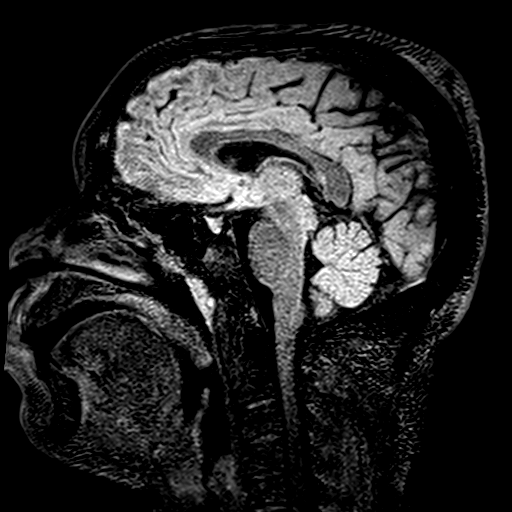
[im 117/176]
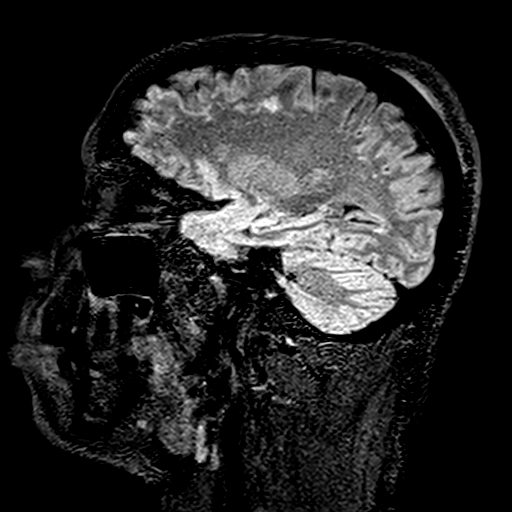
[im 146/176]
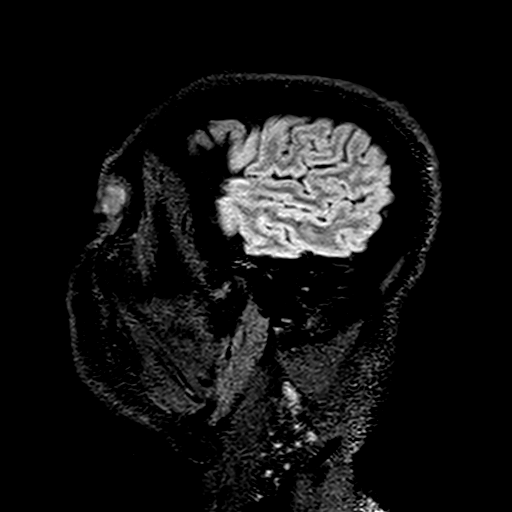
[im 176/176]
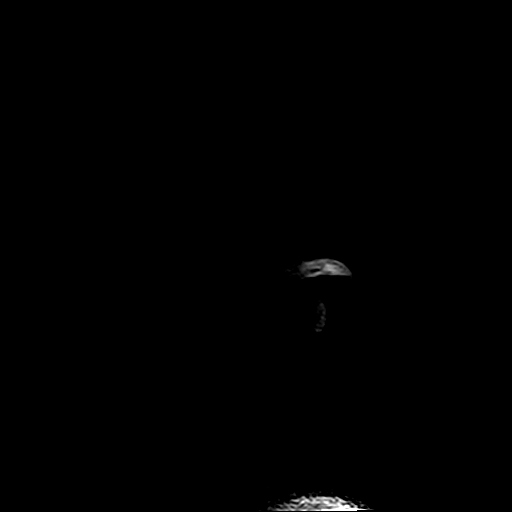

[Series 19: t2_space_dark-fluid_sag_p2_ns-ir_mpr_ axial · axial · 1.0mm · 0.45mm/px · z∈[-93,-33]mm · 3 of 181 slices shown]
[im 1/181]
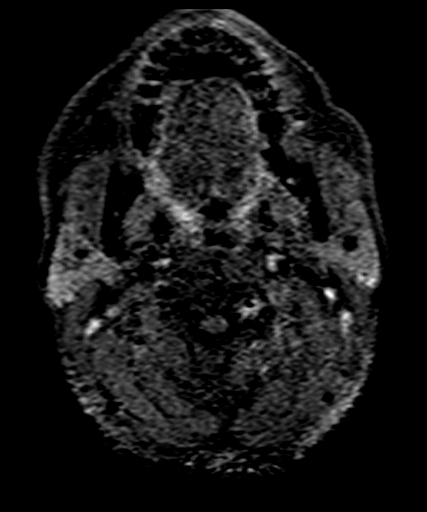
[im 31/181]
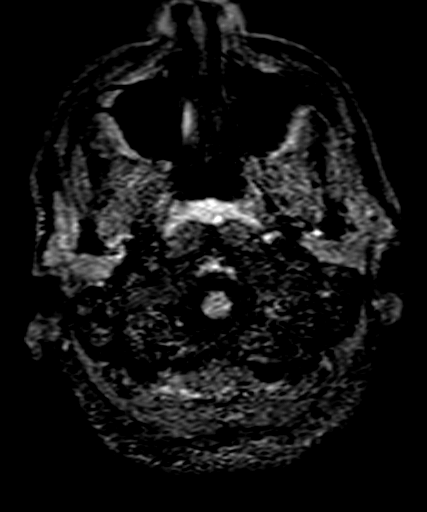
[im 61/181]
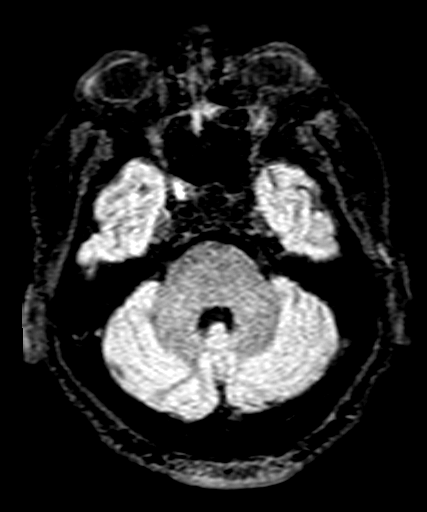

[34 of 48 positions shown; findings below may reference images not displayed]

FINDINGS: Brain: Cerebral volume within normal limits for age. Scattered
patchy T2/FLAIR hyperintensity primarily involving the deep and
subcortical white matter both cerebral hemispheres, nonspecific, but
most commonly related to chronic microvascular ischemic disease,
mild to moderate in nature.

Punctate 4 mm focus of restricted diffusion seen involving the right
occipital lobe, consistent with a small acute ischemic infarct
(series 5, image 76). No associated hemorrhage or mass effect. No
other evidence for acute or subacute ischemia. Gray-white matter
differentiation otherwise maintained. No encephalomalacia to suggest
chronic cortical infarction elsewhere within the brain. No other
evidence for acute or chronic intracranial hemorrhage.

No mass lesion, midline shift or mass effect. No hydrocephalus or
extra-axial fluid collection. Pituitary gland and suprasellar region
within normal limits. Midline structures intact.

Vascular: Major intracranial vascular flow voids are maintained.

Skull and upper cervical spine: Craniocervical junction within
normal limits. Bone marrow signal intensity normal. Posterior scalp
contusion/laceration noted.

Sinuses/Orbits: Globes and orbital soft tissues within normal
limits. Paranasal sinuses are largely clear. Small bilateral mastoid
effusions noted, of doubtful significance. Inner ear structures
grossly normal.

Other: None.
IMPRESSION: 1. Punctate 4 mm acute ischemic nonhemorrhagic right occipital
infarct.
2. No other acute intracranial abnormality.
3. Mild to moderate cerebral white matter disease, nonspecific, but
most commonly related to chronic microvascular ischemic disease.
4. Posterior scalp contusion/laceration.

## 2023-02-10 ENCOUNTER — Emergency Department (HOSPITAL_COMMUNITY)
Admission: EM | Admit: 2023-02-10 | Discharge: 2023-02-10 | Discharge status: Against Medical Advice | Payer: Self-pay | Attending: Emergency Medicine | Admitting: Emergency Medicine

## 2023-02-10 ENCOUNTER — Emergency Department (HOSPITAL_COMMUNITY): Payer: Self-pay

## 2023-02-10 ENCOUNTER — Encounter (HOSPITAL_COMMUNITY): Payer: Self-pay

## 2023-02-10 ENCOUNTER — Emergency Department (HOSPITAL_COMMUNITY)
Admission: EM | Admit: 2023-02-10 | Discharge: 2023-02-10 | Disposition: A | Discharge status: Home / Self Care | Payer: Self-pay | Attending: Emergency Medicine | Admitting: Emergency Medicine

## 2023-02-10 ENCOUNTER — Other Ambulatory Visit: Payer: Self-pay

## 2023-02-10 DIAGNOSIS — R519 Headache, unspecified: Secondary | ICD-10-CM | POA: Insufficient documentation

## 2023-02-10 DIAGNOSIS — R0781 Pleurodynia: Secondary | ICD-10-CM | POA: Insufficient documentation

## 2023-02-10 DIAGNOSIS — F17210 Nicotine dependence, cigarettes, uncomplicated: Secondary | ICD-10-CM | POA: Insufficient documentation

## 2023-02-10 DIAGNOSIS — M542 Cervicalgia: Secondary | ICD-10-CM | POA: Insufficient documentation

## 2023-02-10 DIAGNOSIS — R42 Dizziness and giddiness: Secondary | ICD-10-CM | POA: Insufficient documentation

## 2023-02-10 DIAGNOSIS — R110 Nausea: Secondary | ICD-10-CM | POA: Insufficient documentation

## 2023-02-10 DIAGNOSIS — R0789 Other chest pain: Secondary | ICD-10-CM

## 2023-02-10 DIAGNOSIS — M546 Pain in thoracic spine: Secondary | ICD-10-CM | POA: Insufficient documentation

## 2023-02-10 DIAGNOSIS — S060XAA Concussion with loss of consciousness status unknown, initial encounter: Secondary | ICD-10-CM

## 2023-02-10 DIAGNOSIS — Z5321 Procedure and treatment not carried out due to patient leaving prior to being seen by health care provider: Secondary | ICD-10-CM | POA: Insufficient documentation

## 2023-02-10 MED ORDER — OXYCODONE HCL 5 MG PO TABS
5.0000 mg | ORAL_TABLET | Freq: Once | ORAL | Status: AC
  Administered 2023-02-10: 5 mg via ORAL
  Filled 2023-02-10: qty 1

## 2023-02-10 NOTE — ED Notes (Signed)
Patient transported to CT 

## 2023-02-10 NOTE — ED Notes (Signed)
Provided pt blanket.

## 2023-02-10 NOTE — Discharge Instructions (Addendum)
Your CT and XR imaging was reassuring, symptoms of headache are consistent with potential concussion. Recommend Tylenol and Motrin for pain control.
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
## 2023-02-10 NOTE — ED Notes (Signed)
Pt called multiple times for recheck vitals, no answer 
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
## 2023-02-10 NOTE — ED Notes (Signed)
Pt stated he feels lightheaded and unsure why. Pt states intake has been good. Pt states he has been laying in the bed since arriving to hospital.
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
## 2023-02-10 NOTE — ED Triage Notes (Signed)
Patient reports pain at right lower back for several days worse this morning when changing positions/movement and coughing .
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
## 2023-12-04 ENCOUNTER — Emergency Department (HOSPITAL_COMMUNITY): Admission: EM | Admit: 2023-12-04 | Discharge: 2023-12-04 | Discharge status: Against Medical Advice | Payer: Self-pay

## 2023-12-04 NOTE — ED Notes (Signed)
Called for triage x2
